# Patient Record
Sex: Male | Born: 1965
Health system: Southern US, Community
[De-identification: ages and names within clinical notes are randomized; demographics above are authoritative.]

## PROBLEM LIST (undated history)

## (undated) DIAGNOSIS — T7840XA Allergy, unspecified, initial encounter: Secondary | ICD-10-CM

## (undated) DIAGNOSIS — G4730 Sleep apnea, unspecified: Secondary | ICD-10-CM

## (undated) DIAGNOSIS — N200 Calculus of kidney: Secondary | ICD-10-CM

## (undated) DIAGNOSIS — I10 Essential (primary) hypertension: Secondary | ICD-10-CM

## (undated) DIAGNOSIS — N289 Disorder of kidney and ureter, unspecified: Secondary | ICD-10-CM

## (undated) HISTORY — PX: VASECTOMY: SHX75

## (undated) HISTORY — DX: Sleep apnea, unspecified: G47.30

## (undated) HISTORY — DX: Calculus of kidney: N20.0

## (undated) HISTORY — DX: Allergy, unspecified, initial encounter: T78.40XA

---

## 1997-08-14 ENCOUNTER — Encounter: Admission: RE | Admit: 1997-08-14 | Discharge: 1997-08-14 | Payer: Self-pay | Admitting: Family Medicine

## 1997-08-21 ENCOUNTER — Encounter: Admission: RE | Admit: 1997-08-21 | Discharge: 1997-08-21 | Payer: Self-pay | Admitting: Family Medicine

## 1998-01-23 ENCOUNTER — Encounter: Admission: RE | Admit: 1998-01-23 | Discharge: 1998-01-23 | Payer: Self-pay | Admitting: Family Medicine

## 1998-01-27 ENCOUNTER — Encounter: Admission: RE | Admit: 1998-01-27 | Discharge: 1998-01-27 | Payer: Self-pay | Admitting: Family Medicine

## 1998-02-12 ENCOUNTER — Encounter: Admission: RE | Admit: 1998-02-12 | Discharge: 1998-02-12 | Payer: Self-pay | Admitting: Family Medicine

## 1998-06-03 ENCOUNTER — Encounter: Admission: RE | Admit: 1998-06-03 | Discharge: 1998-06-03 | Payer: Self-pay | Admitting: Sports Medicine

## 1998-06-05 ENCOUNTER — Encounter: Admission: RE | Admit: 1998-06-05 | Discharge: 1998-06-05 | Payer: Self-pay | Admitting: Family Medicine

## 1999-03-04 ENCOUNTER — Ambulatory Visit (HOSPITAL_COMMUNITY): Admission: RE | Admit: 1999-03-04 | Discharge: 1999-03-04 | Payer: Self-pay | Admitting: Family Medicine

## 1999-03-04 ENCOUNTER — Encounter: Admission: RE | Admit: 1999-03-04 | Discharge: 1999-03-04 | Payer: Self-pay | Admitting: Family Medicine

## 1999-03-17 ENCOUNTER — Encounter: Admission: RE | Admit: 1999-03-17 | Discharge: 1999-03-17 | Payer: Self-pay | Admitting: Family Medicine

## 1999-10-27 ENCOUNTER — Encounter: Admission: RE | Admit: 1999-10-27 | Discharge: 1999-10-27 | Payer: Self-pay | Admitting: Family Medicine

## 1999-11-23 ENCOUNTER — Encounter: Admission: RE | Admit: 1999-11-23 | Discharge: 1999-11-23 | Payer: Self-pay | Admitting: Family Medicine

## 2000-05-17 ENCOUNTER — Ambulatory Visit (HOSPITAL_COMMUNITY): Admission: RE | Admit: 2000-05-17 | Discharge: 2000-05-17 | Payer: Self-pay | Admitting: Family Medicine

## 2000-05-17 ENCOUNTER — Encounter: Admission: RE | Admit: 2000-05-17 | Discharge: 2000-05-17 | Payer: Self-pay | Admitting: Family Medicine

## 2000-05-18 ENCOUNTER — Encounter: Admission: RE | Admit: 2000-05-18 | Discharge: 2000-05-18 | Payer: Self-pay | Admitting: Family Medicine

## 2000-06-22 ENCOUNTER — Encounter: Admission: RE | Admit: 2000-06-22 | Discharge: 2000-06-22 | Payer: Self-pay | Admitting: Family Medicine

## 2001-03-29 ENCOUNTER — Encounter: Admission: RE | Admit: 2001-03-29 | Discharge: 2001-03-29 | Payer: Self-pay | Admitting: Family Medicine

## 2001-05-30 ENCOUNTER — Encounter: Payer: Self-pay | Admitting: *Deleted

## 2001-05-30 ENCOUNTER — Encounter: Admission: RE | Admit: 2001-05-30 | Discharge: 2001-05-30 | Payer: Self-pay | Admitting: *Deleted

## 2002-03-17 ENCOUNTER — Emergency Department (HOSPITAL_COMMUNITY): Admission: EM | Admit: 2002-03-17 | Discharge: 2002-03-17 | Payer: Self-pay | Admitting: Emergency Medicine

## 2002-03-17 ENCOUNTER — Encounter: Payer: Self-pay | Admitting: Emergency Medicine

## 2002-08-14 ENCOUNTER — Encounter: Admission: RE | Admit: 2002-08-14 | Discharge: 2002-08-14 | Payer: Self-pay | Admitting: Family Medicine

## 2003-04-02 ENCOUNTER — Encounter: Admission: RE | Admit: 2003-04-02 | Discharge: 2003-04-02 | Payer: Self-pay | Admitting: Sports Medicine

## 2003-04-02 ENCOUNTER — Encounter: Admission: RE | Admit: 2003-04-02 | Discharge: 2003-04-02 | Payer: Self-pay | Admitting: Family Medicine

## 2003-08-24 ENCOUNTER — Emergency Department (HOSPITAL_COMMUNITY): Admission: EM | Admit: 2003-08-24 | Discharge: 2003-08-24 | Payer: Self-pay | Admitting: *Deleted

## 2003-10-22 ENCOUNTER — Encounter: Admission: RE | Admit: 2003-10-22 | Discharge: 2003-10-22 | Payer: Self-pay | Admitting: Family Medicine

## 2003-10-22 ENCOUNTER — Encounter: Admission: RE | Admit: 2003-10-22 | Discharge: 2003-10-22 | Payer: Self-pay | Admitting: Sports Medicine

## 2004-12-15 IMAGING — US US SCROTUM
1 series · 14 of 25 positions shown · non-contrast
Comparison: none

CLINICAL DATA: Mass in the right scrotum. 
 SCROTAL ULTRASOUND: 
 Right testicle measures 5.1 x 2.0 x 3.0cm and the left testicle measures 4.8 x 2.0 x 2.7cm.  There is a 1.8 x 1.2 x 2.2cm epididymal cyst on the right.  No internal echoes.  On the left there is a 1cm epididymal cyst with internal echoes.  There is normal flow to both right and left testicles.  With valsalva there are thought to be varicoceles on the left.

[Series 1: unknown · 0.09mm/px · 14 of 45 slices shown]
[im 1/45]
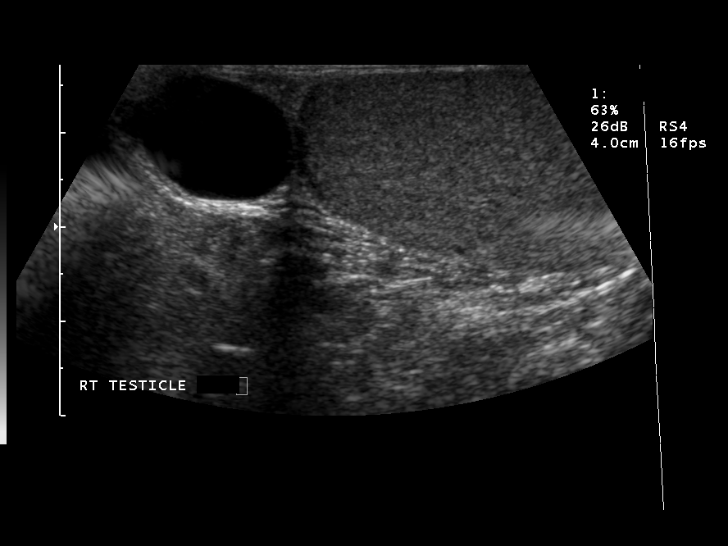
[im 4/45]
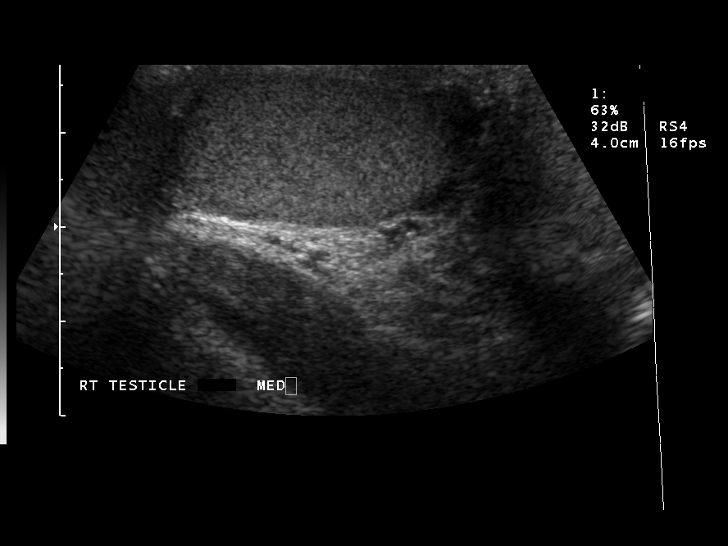
[im 8/45]
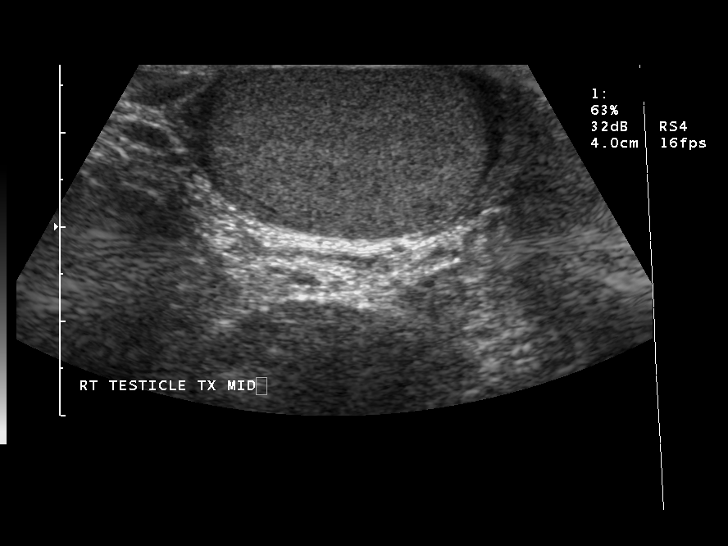
[im 12/45]
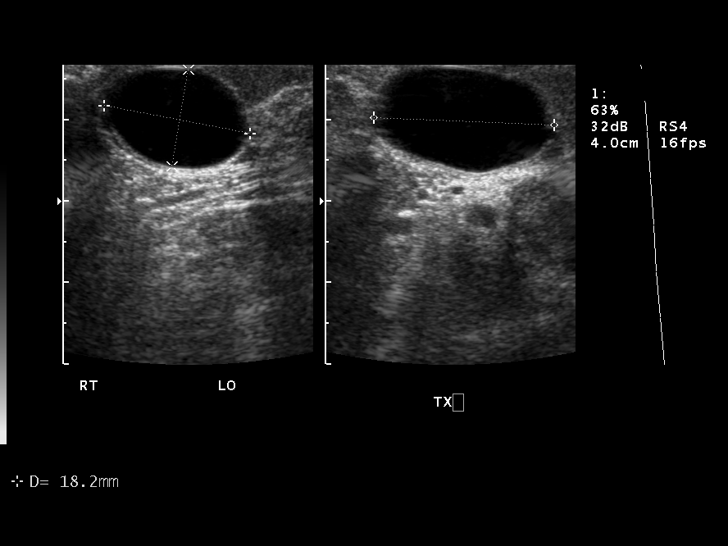
[im 15/45]
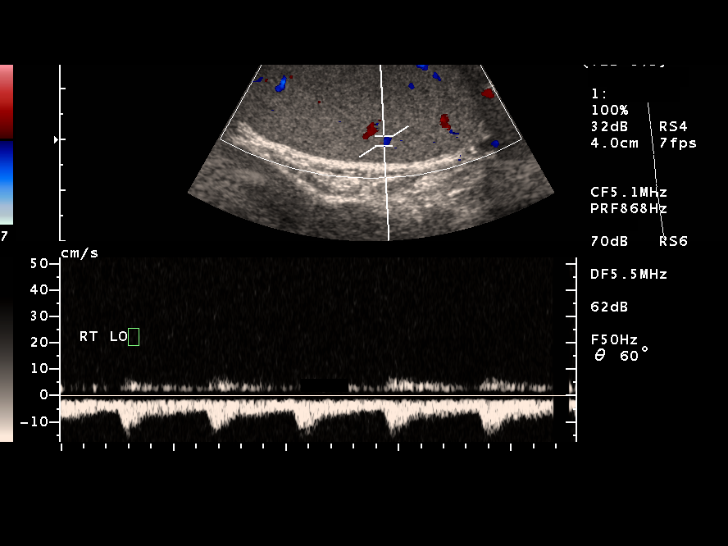
[im 17/45]
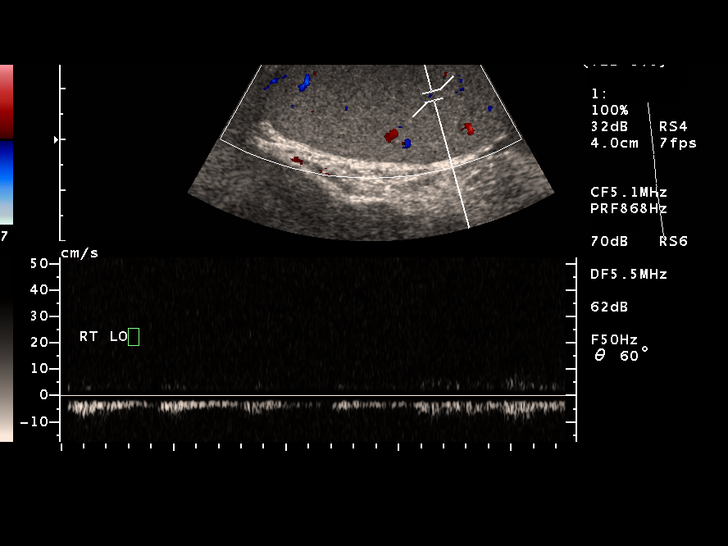
[im 21/45]
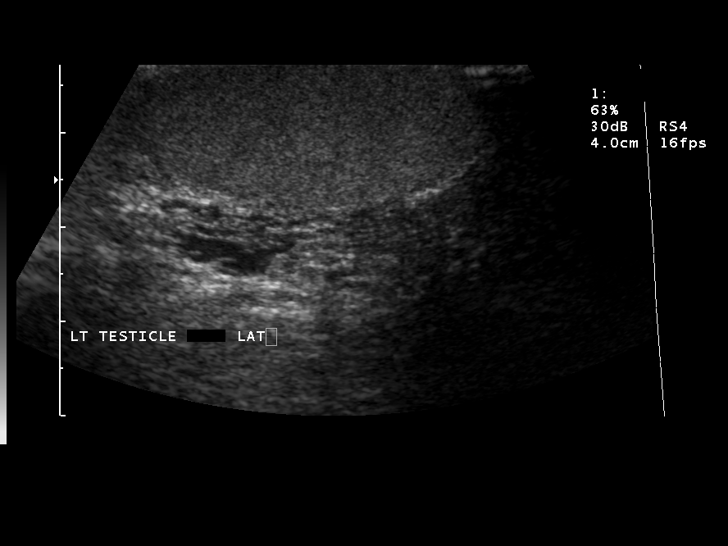
[im 24/45]
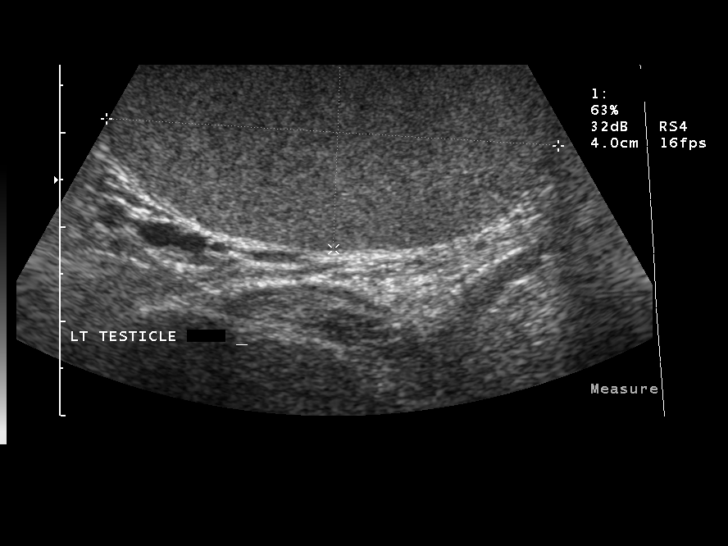
[im 28/45]
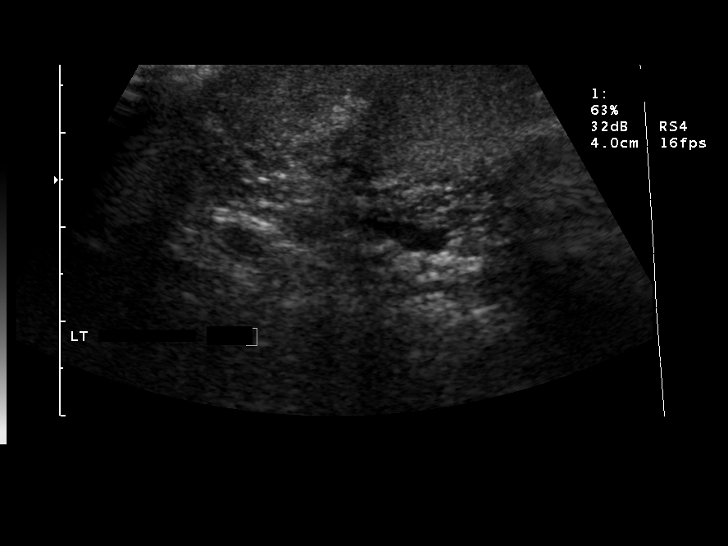
[im 30/45]
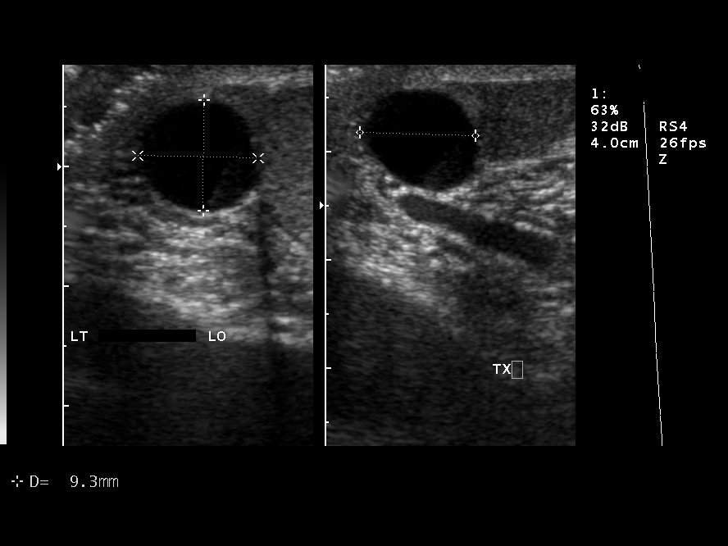
[im 34/45]
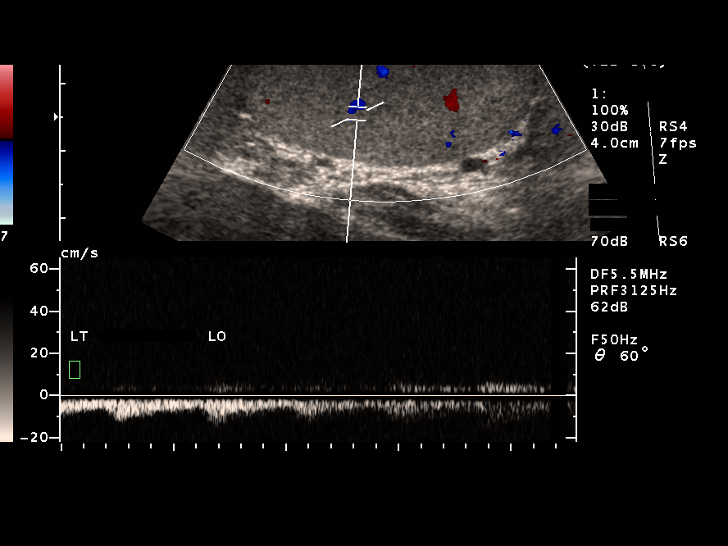
[im 37/45]
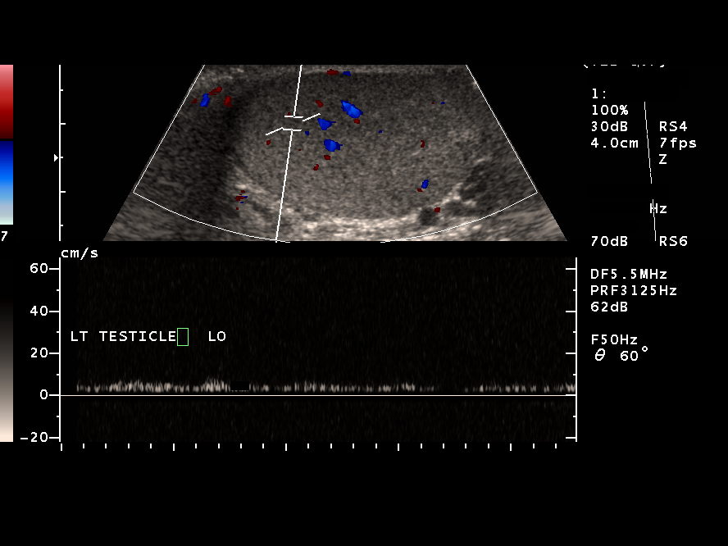
[im 41/45]
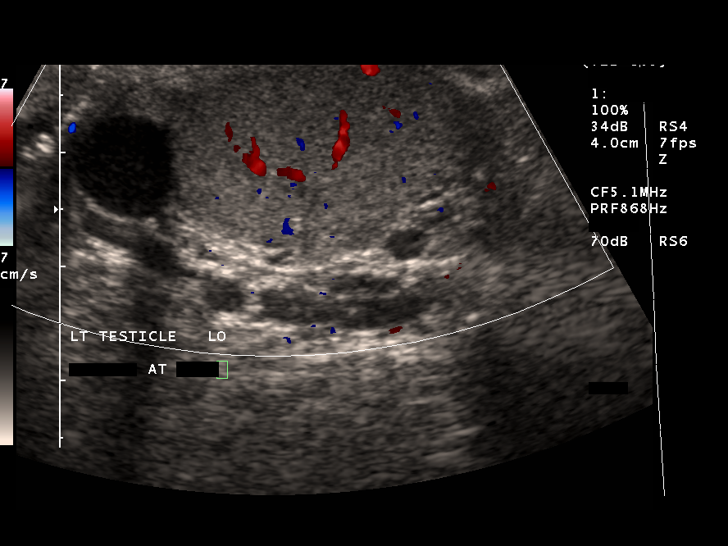
[im 45/45]
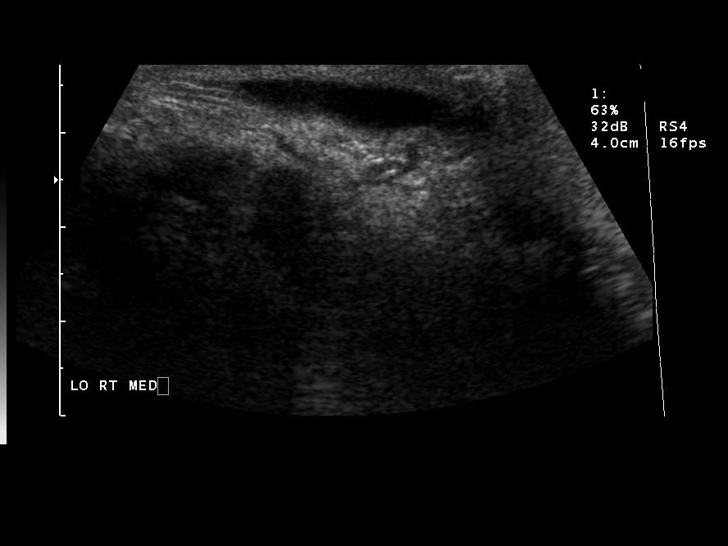

[14 of 25 positions shown; findings below may reference images not displayed]

IMPRESSION: Bilateral epididymal cysts with internal echoes on the left.  Mild varicocele abnormality on the left.

## 2005-05-10 ENCOUNTER — Emergency Department (HOSPITAL_COMMUNITY): Admission: EM | Admit: 2005-05-10 | Discharge: 2005-05-10 | Payer: Self-pay | Admitting: Emergency Medicine

## 2006-05-12 DIAGNOSIS — F411 Generalized anxiety disorder: Secondary | ICD-10-CM | POA: Insufficient documentation

## 2012-02-26 ENCOUNTER — Emergency Department (HOSPITAL_COMMUNITY): Payer: BC Managed Care – PPO

## 2012-02-26 ENCOUNTER — Encounter (HOSPITAL_COMMUNITY): Payer: Self-pay | Admitting: *Deleted

## 2012-02-26 ENCOUNTER — Emergency Department (HOSPITAL_COMMUNITY)
Admission: EM | Admit: 2012-02-26 | Discharge: 2012-02-26 | Disposition: A | Discharge status: Home / Self Care | Payer: BC Managed Care – PPO | Attending: Emergency Medicine | Admitting: Emergency Medicine

## 2012-02-26 DIAGNOSIS — R0602 Shortness of breath: Secondary | ICD-10-CM | POA: Insufficient documentation

## 2012-02-26 DIAGNOSIS — J3489 Other specified disorders of nose and nasal sinuses: Secondary | ICD-10-CM | POA: Insufficient documentation

## 2012-02-26 DIAGNOSIS — IMO0001 Reserved for inherently not codable concepts without codable children: Secondary | ICD-10-CM | POA: Insufficient documentation

## 2012-02-26 DIAGNOSIS — J40 Bronchitis, not specified as acute or chronic: Secondary | ICD-10-CM

## 2012-02-26 DIAGNOSIS — J069 Acute upper respiratory infection, unspecified: Secondary | ICD-10-CM

## 2012-02-26 MED ORDER — ALBUTEROL SULFATE HFA 108 (90 BASE) MCG/ACT IN AERS
2.0000 | INHALATION_SPRAY | RESPIRATORY_TRACT | Status: DC
  Administered 2012-02-26: 2 via RESPIRATORY_TRACT
  Filled 2012-02-26: qty 6.7

## 2012-02-26 MED ORDER — HYDROCOD POLST-CHLORPHEN POLST 10-8 MG/5ML PO LQCR: 5.0000 mL | Freq: Two times a day (BID) | ORAL | Status: DC

## 2012-02-26 MED ORDER — IBUPROFEN 800 MG PO TABS
800.0000 mg | ORAL_TABLET | Freq: Once | ORAL | Status: AC
  Administered 2012-02-26: 800 mg via ORAL
  Filled 2012-02-26: qty 1

## 2012-02-26 MED ORDER — HYDROCOD POLST-CHLORPHEN POLST 10-8 MG/5ML PO LQCR
5.0000 mL | Freq: Once | ORAL | Status: AC
  Administered 2012-02-26: 5 mL via ORAL
  Filled 2012-02-26: qty 5

## 2012-02-26 MED ORDER — PREDNISONE 50 MG PO TABS
60.0000 mg | ORAL_TABLET | Freq: Once | ORAL | Status: AC
  Administered 2012-02-26: 60 mg via ORAL
  Filled 2012-02-26: qty 1

## 2012-02-26 MED ORDER — PREDNISONE 10 MG PO TABS: ORAL_TABLET | ORAL | Status: DC

## 2012-02-26 NOTE — ED Provider Notes (Signed)
Medical screening examination/treatment/procedure(s) were performed by non-physician practitioner and as supervising physician I was immediately available for consultation/collaboration.  Zarielle Cea R. Neosha Switalski, MD 02/26/12 2247 

## 2012-02-26 NOTE — ED Provider Notes (Signed)
History     CSN: [id number]  Arrival date & time 02/26/12  1845   First MD Initiated Contact with Patient 02/26/12 1937      Chief Complaint  Patient presents with  . Cough    (Consider location/radiation/quality/duration/timing/severity/associated sxs/prior treatment) Patient is a 46 y.o. male presenting with cough. The history is provided by the patient.  Cough This is a new problem. The current episode started more than 1 week ago. The problem occurs every few minutes. The problem has not changed since onset.The cough is non-productive. There has been no fever. Associated symptoms include rhinorrhea, myalgias and shortness of breath. Pertinent negatives include no chest pain, no chills, no weight loss and no wheezing. Associated symptoms comments: Chest wall soreness.. He has tried cough syrup for the symptoms. The treatment provided no relief. He is not a smoker. His past medical history is significant for bronchitis. His past medical history does not include COPD, emphysema or asthma.    History reviewed. No pertinent past medical history.  History reviewed. No pertinent past surgical history.  History reviewed. No pertinent family history.  History  Substance Use Topics  . Smoking status: Never Smoker   . Smokeless tobacco: Not on file  . Alcohol Use: No      Review of Systems  Constitutional: Negative for chills, weight loss and activity change.       All ROS Neg except as noted in HPI  HENT: Positive for rhinorrhea. Negative for nosebleeds and neck pain.   Eyes: Negative for photophobia and discharge.  Respiratory: Positive for cough and shortness of breath. Negative for wheezing.   Cardiovascular: Negative for chest pain and palpitations.  Gastrointestinal: Negative for abdominal pain and blood in stool.  Genitourinary: Negative for dysuria, frequency and hematuria.  Musculoskeletal: Positive for myalgias. Negative for back pain and arthralgias.  Skin: Negative.    Neurological: Negative for dizziness, seizures and speech difficulty.  Psychiatric/Behavioral: Negative for hallucinations and confusion.    Allergies  Review of patient's allergies indicates no known allergies.  Home Medications  No current outpatient prescriptions on file.  BP 145/87  Pulse 82  Temp 98.8 F (37.1 C) (Oral)  Resp 16  Ht 5\' 7"  (1.702 m)  Wt 160 lb (72.576 kg)  BMI 25.06 kg/m2  SpO2 97%  Physical Exam  Nursing note and vitals reviewed. Constitutional: He is oriented to person, place, and time. He appears well-developed and well-nourished.  Non-toxic appearance.  HENT:  Head: Normocephalic.  Right Ear: Tympanic membrane and external ear normal.  Left Ear: Tympanic membrane and external ear normal.       Nasal congestion present  Eyes: EOM and lids are normal. Pupils are equal, round, and reactive to light.  Neck: Normal range of motion. Neck supple. Carotid bruit is not present.  Cardiovascular: Normal rate, regular rhythm, normal heart sounds, intact distal pulses and normal pulses.   Pulmonary/Chest: No respiratory distress.       Rhonchi and scattered wheezes present. Symmetrical rise and fall of the chest. Frequent cough during examination.  Abdominal: Soft. Bowel sounds are normal. There is no tenderness. There is no guarding.  Musculoskeletal: Normal range of motion.  Lymphadenopathy:       Head (right side): No submandibular adenopathy present.       Head (left side): No submandibular adenopathy present.    He has no cervical adenopathy.  Neurological: He is alert and oriented to person, place, and time. He has normal strength. No cranial nerve  deficit or sensory deficit.  Skin: Skin is warm and dry.  Psychiatric: He has a normal mood and affect. His speech is normal.    ED Course  Procedures (including critical care time)  Labs Reviewed - No data to display No results found.   No diagnosis found.    MDM  I have reviewed nursing notes,  vital signs, and all appropriate lab and imaging results for this patient. The chest x-ray reveals chronic bronchitic changes. No focal consolidation or pneumonia. Patient coughing less after albuterol and Tussionex. The plan at this time is to treat the patient with steroids, albuterol inhaler, and Tussionex. Patient advised to wash hands frequently and to increase fluids. Patient to see his primary physician if not improving.       Kathie Dike, [place] 02/26/12 2034

## 2012-02-26 NOTE — ED Notes (Signed)
Pt c/o cough and congestion for 3 weeks. Pt states cough worse with deep breath.

## 2012-03-01 ENCOUNTER — Emergency Department (HOSPITAL_COMMUNITY)
Admission: EM | Admit: 2012-03-01 | Discharge: 2012-03-01 | Disposition: A | Discharge status: Home / Self Care | Payer: BC Managed Care – PPO | Attending: Emergency Medicine | Admitting: Emergency Medicine

## 2012-03-01 ENCOUNTER — Encounter (HOSPITAL_COMMUNITY): Payer: Self-pay | Admitting: Emergency Medicine

## 2012-03-01 ENCOUNTER — Emergency Department (HOSPITAL_COMMUNITY): Payer: BC Managed Care – PPO

## 2012-03-01 DIAGNOSIS — Z79899 Other long term (current) drug therapy: Secondary | ICD-10-CM | POA: Insufficient documentation

## 2012-03-01 DIAGNOSIS — N259 Disorder resulting from impaired renal tubular function, unspecified: Secondary | ICD-10-CM | POA: Insufficient documentation

## 2012-03-01 DIAGNOSIS — R109 Unspecified abdominal pain: Secondary | ICD-10-CM | POA: Insufficient documentation

## 2012-03-01 DIAGNOSIS — N201 Calculus of ureter: Secondary | ICD-10-CM | POA: Insufficient documentation

## 2012-03-01 DIAGNOSIS — R319 Hematuria, unspecified: Secondary | ICD-10-CM | POA: Insufficient documentation

## 2012-03-01 HISTORY — DX: Disorder of kidney and ureter, unspecified: N28.9

## 2012-03-01 LAB — URINALYSIS, ROUTINE W REFLEX MICROSCOPIC
Glucose, UA: NEGATIVE mg/dL
Specific Gravity, Urine: 1.025 (ref 1.005–1.030)

## 2012-03-01 LAB — CBC WITH DIFFERENTIAL/PLATELET
Eosinophils Relative: 0 % (ref 0–5)
Hemoglobin: 12.4 g/dL — ABNORMAL LOW (ref 13.0–17.0)
Lymphocytes Relative: 16 % (ref 12–46)
Lymphs Abs: 1.1 10*3/uL (ref 0.7–4.0)
MCV: 91.8 fL (ref 78.0–100.0)
Platelets: 241 10*3/uL (ref 150–400)
RBC: 4.04 MIL/uL — ABNORMAL LOW (ref 4.22–5.81)
WBC: 6.7 10*3/uL (ref 4.0–10.5)

## 2012-03-01 LAB — URINE MICROSCOPIC-ADD ON

## 2012-03-01 LAB — BASIC METABOLIC PANEL
CO2: 29 mEq/L (ref 19–32)
Calcium: 8.2 mg/dL — ABNORMAL LOW (ref 8.4–10.5)
Glucose, Bld: 106 mg/dL — ABNORMAL HIGH (ref 70–99)
Potassium: 3.4 mEq/L — ABNORMAL LOW (ref 3.5–5.1)
Sodium: 138 mEq/L (ref 135–145)

## 2012-03-01 MED ORDER — MORPHINE SULFATE 4 MG/ML IJ SOLN
4.0000 mg | Freq: Once | INTRAMUSCULAR | Status: AC
  Administered 2012-03-01: 4 mg via INTRAVENOUS
  Filled 2012-03-01: qty 1

## 2012-03-01 MED ORDER — ONDANSETRON 4 MG PO TBDP: 4.0000 mg | ORAL_TABLET | Freq: Three times a day (TID) | ORAL | Status: DC | PRN

## 2012-03-01 MED ORDER — HYDROCODONE-ACETAMINOPHEN 5-325 MG PO TABS: 1.0000 | ORAL_TABLET | Freq: Four times a day (QID) | ORAL | Status: DC | PRN

## 2012-03-01 MED ORDER — PROMETHAZINE HCL 25 MG PO TABS: 25.0000 mg | ORAL_TABLET | Freq: Four times a day (QID) | ORAL | Status: DC | PRN

## 2012-03-01 MED ORDER — ONDANSETRON HCL 4 MG/2ML IJ SOLN
4.0000 mg | Freq: Once | INTRAMUSCULAR | Status: AC
  Administered 2012-03-01: 4 mg via INTRAVENOUS
  Filled 2012-03-01: qty 2

## 2012-03-01 MED ORDER — ONDANSETRON HCL 4 MG/2ML IJ SOLN
INTRAMUSCULAR | Status: AC
  Filled 2012-03-01: qty 2

## 2012-03-01 MED ORDER — MORPHINE SULFATE 4 MG/ML IJ SOLN
INTRAMUSCULAR | Status: AC
  Filled 2012-03-01: qty 1

## 2012-03-01 MED ORDER — KETOROLAC TROMETHAMINE 30 MG/ML IJ SOLN
30.0000 mg | Freq: Once | INTRAMUSCULAR | Status: AC
  Administered 2012-03-01: 30 mg via INTRAVENOUS
  Filled 2012-03-01: qty 1

## 2012-03-01 MED ORDER — SODIUM CHLORIDE 0.9 % IV SOLN
INTRAVENOUS | Status: DC
  Administered 2012-03-01: 1000 mL via INTRAVENOUS

## 2012-03-01 MED ORDER — MORPHINE SULFATE 4 MG/ML IJ SOLN
4.0000 mg | Freq: Once | INTRAMUSCULAR | Status: AC
  Administered 2012-03-01: 4 mg via INTRAVENOUS

## 2012-03-01 MED ORDER — ONDANSETRON HCL 4 MG/2ML IJ SOLN
4.0000 mg | Freq: Once | INTRAMUSCULAR | Status: AC
  Administered 2012-03-01: 4 mg via INTRAVENOUS

## 2012-03-01 MED ORDER — SODIUM CHLORIDE 0.9 % IV BOLUS (SEPSIS)
250.0000 mL | Freq: Once | INTRAVENOUS | Status: AC
  Administered 2012-03-01: 250 mL via INTRAVENOUS

## 2012-03-01 NOTE — ED Notes (Signed)
Pt alert & oriented x4, stable gait. Patient given discharge instructions, paperwork & prescription(s). Patient  instructed to stop at the registration desk to finish any additional paperwork. Patient verbalized understanding. Pt left department w/ no further questions. 

## 2012-03-01 NOTE — ED Notes (Signed)
Pt c/o left flank pain

## 2012-03-01 NOTE — ED Provider Notes (Signed)
History     CSN: [id number]  Arrival date & time 03/01/12  7829   First MD Initiated Contact with Patient 03/01/12 1904      Chief Complaint  Patient presents with  . Flank Pain    (Consider location/radiation/quality/duration/timing/severity/associated sxs/prior treatment) Patient is a 46 y.o. male presenting with flank pain. The history is provided by the patient and the spouse.  Flank Pain Associated symptoms include abdominal pain. Pertinent negatives include no chest pain, no headaches and no shortness of breath.   patient with history of renal stones about every 3-5 years. Acute onset of left flank pain at about 5 PM this evening. Associated with nausea no vomiting the pain escalated to a 10 out of 10 sharp and colicky in nature nonradiating. [facility]'s [facility] currently does not have a urologist. Last done was 3-4 years ago.  Past Medical History  Diagnosis Date  . Renal disorder     Past Surgical History  Procedure Date  . Vasectomy     History reviewed. No pertinent family history.  History  Substance Use Topics  . Smoking status: Never Smoker   . Smokeless tobacco: Not on file  . Alcohol Use: No      Review of Systems  Constitutional: Negative for fever.  HENT: Negative for congestion and neck pain.   Respiratory: Negative for shortness of breath.   Cardiovascular: Negative for chest pain.  Gastrointestinal: Positive for nausea and abdominal pain. Negative for vomiting and diarrhea.  Genitourinary: Positive for hematuria and flank pain. Negative for dysuria.  Skin: Negative for rash.  Neurological: Negative for headaches.  Hematological: Does not bruise/bleed easily.    Allergies  Review of patient's allergies indicates no known allergies.  Home Medications   Current Outpatient Rx  Name  Route  Sig  Dispense  Refill  . HYDROCOD POLST-CPM POLST ER 10-8 MG/5ML PO LQCR   Oral   Take 5 mLs by mouth every 12 (twelve)  hours.   140 mL   0   . PREDNISONE 10 MG PO TABS   Oral   Take 10-60 mg by mouth. 6,5,4,3,2,1 - take with food         . HYDROCODONE-ACETAMINOPHEN 5-325 MG PO TABS   Oral   Take 1-2 tablets by mouth every 6 (six) hours as needed for pain.   20 tablet   0   . ONDANSETRON 4 MG PO TBDP   Oral   Take 1 tablet (4 mg total) by mouth every 8 (eight) hours as needed for nausea.   10 tablet   0   . PROMETHAZINE HCL 25 MG PO TABS   Oral   Take 1 tablet (25 mg total) by mouth every 6 (six) hours as needed for nausea.   12 tablet   0     BP 139/93  Pulse 91  Temp 97.8 F (36.6 C) (Oral)  Resp 22  Ht 5\' 7"  (1.702 m)  Wt 160 lb (72.576 kg)  BMI 25.06 kg/m2  SpO2 98%  Physical Exam  Vitals reviewed. Constitutional: He is oriented to person, place, and time. He appears well-developed and well-nourished. He appears distressed.  HENT:  Head: Normocephalic and atraumatic.  Mouth/Throat: Oropharynx is clear and moist.  Eyes: Conjunctivae normal and EOM are normal. Pupils are equal, round, and reactive to light.  Neck: Normal range of motion. Neck supple.  Cardiovascular: Normal rate, regular rhythm and normal heart sounds.   Pulmonary/Chest: Effort normal and breath  sounds normal. No respiratory distress. He has no wheezes.  Abdominal: Soft. Bowel sounds are normal. There is no tenderness.  Musculoskeletal: Normal range of motion. He exhibits no edema.  Neurological: He is alert and oriented to person, place, and time. No cranial nerve deficit. Coordination normal.  Skin: Skin is warm. No rash noted.    ED Course  Procedures (including critical care time)  Labs Reviewed  CBC WITH DIFFERENTIAL - Abnormal; Notable for the following:    RBC 4.04 (*)     Hemoglobin 12.4 (*)     HCT 37.1 (*)     Monocytes Relative 20 (*)     Monocytes Absolute 1.3 (*)     All other components within normal limits  BASIC METABOLIC PANEL - Abnormal; Notable for the following:    Potassium 3.4  (*)     Glucose, Bld 106 (*)     Calcium 8.2 (*)     All other components within normal limits  URINALYSIS, ROUTINE W REFLEX MICROSCOPIC - Abnormal; Notable for the following:    Hgb urine dipstick MODERATE (*)     All other components within normal limits  URINE MICROSCOPIC-ADD ON - Abnormal; Notable for the following:    Squamous Epithelial / LPF FEW (*)     All other components within normal limits   Ct Abdomen Pelvis Wo Contrast  03/01/2012  *RADIOLOGY REPORT*  Clinical Data: Left flank pain, history of kidney stones  CT ABDOMEN AND PELVIS WITHOUT CONTRAST  Technique:  Multidetector CT imaging of the abdomen and pelvis was performed following the standard protocol without intravenous contrast.  Comparison: None.  Findings: Lung bases are clear.  Unenhanced liver, spleen, pancreas, and adrenal glands are within normal limits.  Gallbladder is underdistended.  No intrahepatic or extrahepatic ductal dilatation.  Right kidney is unremarkable.  Mild left hydronephrosis.  No renal calculi.  No evidence of bowel obstruction.  No evidence of abdominal aortic aneurysm.  No abdominopelvic ascites.  No suspicious abdominopelvic lymphadenopathy.  Prostate is unremarkable.  3 mm distal left ureteral calculus (coronal image 49).  Bladder is within normal limits.  Mild degenerative changes of the visualized thoracolumbar spine.  IMPRESSION: 3 mm distal left ureteral calculus with mild left hydronephrosis.   Original Report Authenticated By: Charline Bills, M.D.    Results for orders placed during the hospital encounter of 03/01/12  CBC WITH DIFFERENTIAL      Component Value Range   WBC 6.7  4.0 - 10.5 K/uL   RBC 4.04 (*) 4.22 - 5.81 MIL/uL   Hemoglobin 12.4 (*) 13.0 - 17.0 g/dL   HCT 40.9 (*) 81.1 - 91.4 %   MCV 91.8  78.0 - 100.0 fL   MCH 30.7  26.0 - 34.0 pg   MCHC 33.4  30.0 - 36.0 g/dL   RDW 78.2  95.6 - 21.3 %   Platelets 241  150 - 400 K/uL   Neutrophils Relative 64  43 - 77 %   Neutro Abs  4.3  1.7 - 7.7 K/uL   Lymphocytes Relative 16  12 - 46 %   Lymphs Abs 1.1  0.7 - 4.0 K/uL   Monocytes Relative 20 (*) 3 - 12 %   Monocytes Absolute 1.3 (*) 0.1 - 1.0 K/uL   Eosinophils Relative 0  0 - 5 %   Eosinophils Absolute 0.0  0.0 - 0.7 K/uL   Basophils Relative 0  0 - 1 %   Basophils Absolute 0.0  0.0 - 0.1 K/uL  BASIC METABOLIC PANEL      Component Value Range   Sodium 138  135 - 145 mEq/L   Potassium 3.4 (*) 3.5 - 5.1 mEq/L   Chloride 103  96 - 112 mEq/L   CO2 29  19 - 32 mEq/L   Glucose, Bld 106 (*) 70 - 99 mg/dL   BUN 23  6 - 23 mg/dL   Creatinine, Ser 4.09  0.50 - 1.35 mg/dL   Calcium 8.2 (*) 8.4 - 10.5 mg/dL   GFR calc non Af Amer >90  >90 mL/min   GFR calc Af Amer >90  >90 mL/min  URINALYSIS, ROUTINE W REFLEX MICROSCOPIC      Component Value Range   Color, Urine YELLOW  YELLOW   APPearance CLEAR  CLEAR   Specific Gravity, Urine 1.025  1.005 - 1.030   pH 5.5  5.0 - 8.0   Glucose, UA NEGATIVE  NEGATIVE mg/dL   Hgb urine dipstick MODERATE (*) NEGATIVE   Bilirubin Urine NEGATIVE  NEGATIVE   Ketones, ur NEGATIVE  NEGATIVE mg/dL   Protein, ur NEGATIVE  NEGATIVE mg/dL   Urobilinogen, UA 0.2  0.0 - 1.0 mg/dL   Nitrite NEGATIVE  NEGATIVE   Leukocytes, UA NEGATIVE  NEGATIVE  URINE MICROSCOPIC-ADD ON      Component Value Range   Squamous Epithelial / LPF FEW (*) RARE   WBC, UA 0-2  <3 WBC/hpf   RBC / HPF 11-20  <3 RBC/hpf   Bacteria, UA RARE  RARE     1. Ureteral stone       MDM   Patient with history of kidney stones has one about every 3-5 years. Onset of left flank pain that acute at 5:00 this evening. No vomiting but does have nausea. Follow[facility] by [facility]. Currently does not have a urologist. CT shows a 3 mm stone on the left side with hematuria on labs consistent with renal stone. No significant hydronephrosis or renal insufficiency no significant leukocytosis. Patient improved in the emergency department with morphine IV as a bad reaction  to the lauded. Will discharge home with hydrocodone anti-medics and 800 mg of Motrin every 8 hours. Patient read dose with pain medicine and nausea medicine right prior to discharge along with Toradol. Patient referred to [facility] [place] [facility] for followup.       Shelda Jakes, MD 03/01/12 2122

## 2012-03-10 ENCOUNTER — Emergency Department (HOSPITAL_COMMUNITY): Payer: BC Managed Care – PPO

## 2012-03-10 ENCOUNTER — Encounter (HOSPITAL_COMMUNITY): Payer: Self-pay | Admitting: *Deleted

## 2012-03-10 ENCOUNTER — Emergency Department (HOSPITAL_COMMUNITY)
Admission: EM | Admit: 2012-03-10 | Discharge: 2012-03-10 | Disposition: A | Discharge status: Home / Self Care | Payer: BC Managed Care – PPO | Attending: Emergency Medicine | Admitting: Emergency Medicine

## 2012-03-10 DIAGNOSIS — R319 Hematuria, unspecified: Secondary | ICD-10-CM | POA: Insufficient documentation

## 2012-03-10 DIAGNOSIS — R350 Frequency of micturition: Secondary | ICD-10-CM | POA: Insufficient documentation

## 2012-03-10 DIAGNOSIS — R300 Dysuria: Secondary | ICD-10-CM | POA: Insufficient documentation

## 2012-03-10 DIAGNOSIS — R358 Other polyuria: Secondary | ICD-10-CM | POA: Insufficient documentation

## 2012-03-10 DIAGNOSIS — N200 Calculus of kidney: Secondary | ICD-10-CM

## 2012-03-10 DIAGNOSIS — R3589 Other polyuria: Secondary | ICD-10-CM | POA: Insufficient documentation

## 2012-03-10 DIAGNOSIS — Z87448 Personal history of other diseases of urinary system: Secondary | ICD-10-CM | POA: Insufficient documentation

## 2012-03-10 DIAGNOSIS — R1110 Vomiting, unspecified: Secondary | ICD-10-CM | POA: Insufficient documentation

## 2012-03-10 LAB — URINALYSIS, ROUTINE W REFLEX MICROSCOPIC
Ketones, ur: NEGATIVE mg/dL
Leukocytes, UA: NEGATIVE
Nitrite: NEGATIVE

## 2012-03-10 MED ORDER — ONDANSETRON HCL 4 MG/2ML IJ SOLN
4.0000 mg | Freq: Once | INTRAMUSCULAR | Status: AC
  Administered 2012-03-10: 4 mg via INTRAVENOUS
  Filled 2012-03-10: qty 2

## 2012-03-10 MED ORDER — SODIUM CHLORIDE 0.9 % IV SOLN
Freq: Once | INTRAVENOUS | Status: AC
  Administered 2012-03-10: 15:00:00 via INTRAVENOUS

## 2012-03-10 MED ORDER — FENTANYL CITRATE 0.05 MG/ML IJ SOLN
INTRAMUSCULAR | Status: AC
  Administered 2012-03-10: 50 ug
  Filled 2012-03-10: qty 2

## 2012-03-10 MED ORDER — TAMSULOSIN HCL 0.4 MG PO CAPS: 0.4000 mg | ORAL_CAPSULE | Freq: Every day | ORAL | Status: DC

## 2012-03-10 MED ORDER — KETOROLAC TROMETHAMINE 30 MG/ML IJ SOLN
30.0000 mg | Freq: Once | INTRAMUSCULAR | Status: AC
  Administered 2012-03-10: 30 mg via INTRAVENOUS
  Filled 2012-03-10: qty 1

## 2012-03-10 MED ORDER — KETOROLAC TROMETHAMINE 10 MG PO TABS: 10.0000 mg | ORAL_TABLET | Freq: Four times a day (QID) | ORAL | Status: DC | PRN

## 2012-03-10 MED ORDER — ONDANSETRON HCL 4 MG/2ML IJ SOLN
INTRAMUSCULAR | Status: AC
  Administered 2012-03-10: 4 mg
  Filled 2012-03-10: qty 2

## 2012-03-10 MED ORDER — MORPHINE SULFATE 4 MG/ML IJ SOLN
4.0000 mg | Freq: Once | INTRAMUSCULAR | Status: AC
  Administered 2012-03-10: 4 mg via INTRAVENOUS
  Filled 2012-03-10: qty 1

## 2012-03-10 MED ORDER — OXYCODONE-ACETAMINOPHEN 5-325 MG PO TABS: 1.0000 | ORAL_TABLET | Freq: Four times a day (QID) | ORAL | Status: DC | PRN

## 2012-03-10 NOTE — ED Notes (Signed)
Pt states left flank pain, vomited x 2 and blood in urine. Symptoms began this morning.

## 2012-03-10 NOTE — ED Provider Notes (Addendum)
History   This chart was scribed for Donnetta Hutching, MD, by Frederik Pear, ER scribe. The patient was seen in room APA01/APA01 and the patient's care was started at 1506.    CSN: [id number]  Arrival date & time 03/10/12  1420   First MD Initiated Contact with Patient 03/10/12 1506      Chief Complaint  Patient presents with  . Nephrolithiasis    (Consider location/radiation/quality/duration/timing/severity/associated sxs/prior treatment) HPI  Stephen Sanders is a 46 y.o. male with a h/o of nephrolithiasis who presents to the Emergency Department complaining of constant, moderate, left flank pain with associated polyuria, hematuria, emesis 2x,  and dysuria that radiates to his genitalia and began this morning. He was last seen on 12/18 for similar symptoms and was diagnosed with nephrolithiasis.  Nothing makes symptoms better or worse    PCP is [facility].   Past Medical History  Diagnosis Date  . Renal disorder     Past Surgical History  Procedure Date  . Vasectomy     No family history on file.  History  Substance Use Topics  . Smoking status: Never Smoker   . Smokeless tobacco: Not on file  . Alcohol Use: No      Review of Systems  Gastrointestinal: Positive for vomiting.  Genitourinary: Positive for dysuria, frequency and hematuria.  Musculoskeletal:       Left flank pain  All other systems reviewed and are negative.    Allergies  Review of patient's allergies indicates no known allergies.  Home Medications   Current Outpatient Rx  Name  Route  Sig  Dispense  Refill  . HYDROCOD POLST-CPM POLST ER 10-8 MG/5ML PO LQCR   Oral   Take 5 mLs by mouth every 12 (twelve) hours.   140 mL   0   . HYDROCODONE-ACETAMINOPHEN 5-325 MG PO TABS   Oral   Take 1-2 tablets by mouth every 6 (six) hours as needed for pain.   20 tablet   0   . ONDANSETRON 4 MG PO TBDP   Oral   Take 1 tablet (4 mg total) by mouth every 8 (eight) hours as needed  for nausea.   10 tablet   0   . PROMETHAZINE HCL 25 MG PO TABS   Oral   Take 1 tablet (25 mg total) by mouth every 6 (six) hours as needed for nausea.   12 tablet   0     BP 126/60  Pulse 81  Temp 97.9 F (36.6 C) (Oral)  Resp 16  Ht 5\' 7"  (1.702 m)  Wt 160 lb (72.576 kg)  BMI 25.06 kg/m2  SpO2 99%  Physical Exam  Nursing note and vitals reviewed. Constitutional: He is oriented to person, place, and time. He appears well-developed and well-nourished.  HENT:  Head: Normocephalic and atraumatic.  Eyes: Conjunctivae normal and EOM are normal. Pupils are equal, round, and reactive to light.  Neck: Normal range of motion. Neck supple.  Cardiovascular: Normal rate, regular rhythm and normal heart sounds.   Pulmonary/Chest: Effort normal and breath sounds normal.  Abdominal: Soft. Bowel sounds are normal.       He has minimal left flank tenderness.  Musculoskeletal: Normal range of motion.  Neurological: He is alert and oriented to person, place, and time.  Skin: Skin is warm and dry.  Psychiatric: He has a normal mood and affect.    ED Course  Procedures (including critical care time)  DIAGNOSTIC STUDIES: Oxygen Saturation is  99% on room air, normal by my interpretation.    COORDINATION OF CARE:  15:21- Discussed planned course of treatment with the patient, including pain medication and an abdominal X-ray, who is agreeable at this time.  Results for orders placed during the hospital encounter of 03/10/12  URINALYSIS, ROUTINE W REFLEX MICROSCOPIC      Component Value Range   Color, Urine YELLOW  YELLOW   APPearance CLEAR  CLEAR   Specific Gravity, Urine 1.015  1.005 - 1.030   pH 8.5 (*) 5.0 - 8.0   Glucose, UA NEGATIVE  NEGATIVE mg/dL   Hgb urine dipstick LARGE (*) NEGATIVE   Bilirubin Urine NEGATIVE  NEGATIVE   Ketones, ur NEGATIVE  NEGATIVE mg/dL   Protein, ur TRACE (*) NEGATIVE mg/dL   Urobilinogen, UA 0.2  0.0 - 1.0 mg/dL   Nitrite NEGATIVE  NEGATIVE    Leukocytes, UA NEGATIVE  NEGATIVE  URINE MICROSCOPIC-ADD ON      Component Value Range   WBC, UA 0-2  <3 WBC/hpf   RBC / HPF TOO NUMEROUS TO COUNT  <3 RBC/hpf    Dg Abd 1 View  03/10/2012  *RADIOLOGY REPORT*  Clinical Data: Left flank pain.  ABDOMEN - 1 VIEW  Comparison: CT 03/01/2012.  Findings: Normal bowel gas pattern. Small calcification of approximately the same size as the previously seen distal left ureteral stone now projects near the midline.  This may now be within the bladder although this is difficult to confirm by plain film.  No additional suspicious calcifications.  No free air on the supine image.  No acute bony abnormality.  IMPRESSION: Small calcification near the midline of the pelvis may represent the previously seen distal left ureteral stone.  The near midline location may signify passage into the bladder, but exact location cannot be confirmed by plain film.   Original Report Authenticated By: Charlett Nose, M.D.      No diagnosis found.    MDM  Good history for left-sided stone. Urinalysis shows too numerous to count red cells. Plain x-rays reveals calcification consistent with a distal left ureteral stone. Discharge home on Percocet #30, Flomax 0.4 mg #20 and Toradol 10 mg tablets #20.  Followup with urologist  I personally performed the services described in this documentation, which was scribed in my presence. The recorded information has been reviewed and is accurate.        Donnetta Hutching, MD 03/10/12 1740  Donnetta Hutching, MD 03/10/12 [phone]

## 2014-12-30 ENCOUNTER — Other Ambulatory Visit (HOSPITAL_COMMUNITY): Payer: Self-pay | Admitting: Radiology

## 2014-12-30 DIAGNOSIS — G4730 Sleep apnea, unspecified: Secondary | ICD-10-CM

## 2015-04-12 ENCOUNTER — Ambulatory Visit (HOSPITAL_BASED_OUTPATIENT_CLINIC_OR_DEPARTMENT_OTHER): Payer: Self-pay | Attending: Internal Medicine | Admitting: Sleep Medicine

## 2015-04-12 VITALS — Ht 67.0 in | Wt 170.0 lb

## 2015-04-12 DIAGNOSIS — G4730 Sleep apnea, unspecified: Secondary | ICD-10-CM | POA: Insufficient documentation

## 2015-04-12 DIAGNOSIS — G4733 Obstructive sleep apnea (adult) (pediatric): Secondary | ICD-10-CM | POA: Insufficient documentation

## 2015-04-12 DIAGNOSIS — G4710 Hypersomnia, unspecified: Secondary | ICD-10-CM | POA: Insufficient documentation

## 2015-04-12 DIAGNOSIS — Z79899 Other long term (current) drug therapy: Secondary | ICD-10-CM | POA: Insufficient documentation

## 2015-04-12 DIAGNOSIS — R0683 Snoring: Secondary | ICD-10-CM | POA: Insufficient documentation

## 2015-04-18 NOTE — Sleep Study (Signed)
  [facility] Geana Walts A. Gerilyn Pilgrim, MD     [email or web address]             NOCTURNAL POLYSOMNOGRAPHY   LOCATION: [facility]  Patient Name: Stephen Sanders, Stephen Sanders Date: 04/12/2015 Gender: Male D.O.B: 04/18/1965 Age (years): 30 Referring Provider: Not Available Height (inches): 67 Interpreting Physician: Beryle Beams MD, ABSM Weight (lbs): 170 RPSGT: Alfonso Ellis BMI: 27 MRN: [id number] Neck Size: 16.50 CLINICAL INFORMATION Sleep Study Type: NPSG Indication for sleep study: N/A Epworth Sleepiness Score: 20 SLEEP STUDY TECHNIQUE As per the AASM Manual for the Scoring of Sleep and Associated Events v2.3 (April 2016) with a hypopnea requiring 4% desaturations. The channels recorded and monitored were frontal, central and occipital EEG, electrooculogram (EOG), submentalis EMG (chin), nasal and oral airflow, thoracic and abdominal wall motion, anterior tibialis EMG, snore microphone, electrocardiogram, and pulse oximetry. MEDICATIONS Patient's medications include: N/A. Medications self-administered by patient during sleep study : No sleep medicine administered. Prior to Admission medications   Medication Sig Start Date End Date Taking? Authorizing Provider  chlorpheniramine-HYDROcodone (TUSSIONEX PENNKINETIC ER) 10-8 MG/5ML LQCR Take 5 mLs by mouth every 12 (twelve) hours. 02/26/12   Ivery Quale, PA-C  ibuprofen (ADVIL,MOTRIN) 200 MG tablet Take 200 mg by mouth every 6 (six) hours as needed. For pain    Historical Provider, MD  ketorolac (TORADOL) 10 MG tablet Take 1 tablet (10 mg total) by mouth every 6 (six) hours as needed for pain. 03/10/12   Donnetta Hutching, MD  oxyCODONE-acetaminophen (PERCOCET/ROXICET) 5-325 MG per tablet Take 1-2 tablets by mouth every 6 (six) hours as needed for pain. 03/10/12   Donnetta Hutching, MD  Tamsulosin HCl (FLOMAX) 0.4 MG CAPS Take 1 capsule (0.4 mg total) by mouth daily. 03/10/12   Donnetta Hutching, MD    SLEEP ARCHITECTURE The study was initiated  at 10:57:42 PM and ended at 5:16:29 AM. Sleep onset time was 2.2 minutes and the sleep efficiency was 92.5%. The total sleep time was 350.5 minutes. Stage REM latency was 271.5 minutes. The patient spent 2.71% of the night in stage N1 sleep, 65.76% in stage N2 sleep, 26.39% in stage N3 and 5.14% in REM. Alpha intrusion was absent. Supine sleep was 100.00%. RESPIRATORY PARAMETERS The overall apnea/hypopnea index (AHI) was 19.5 per hour. There were 24 total apneas, including 20 obstructive, 4 central and 0 mixed apneas. There were 90 hypopneas and 0 RERAs. The AHI during Stage REM sleep was 16.7 per hour. AHI while supine was 19.5 per hour. The mean oxygen saturation was 96.05%. The minimum SpO2 during sleep was 81.00%. Loud snoring was noted during this study. CARDIAC DATA The 2 lead EKG demonstrated sinus rhythm. The mean heart rate was N/A beats per minute. Other EKG findings include: None. LEG MOVEMENT DATA The total PLMS were 0 with a resulting PLMS index of 0.00. Associated arousal with leg movement index was 0.0.  IMPRESSIONS - Moderate obstructive sleep apnea. Suggest AutoPap 8-12.   Argie Ramming, MD Diplomate, American Board of Sleep Medicine.

## 2017-04-05 ENCOUNTER — Encounter (HOSPITAL_COMMUNITY): Payer: Self-pay | Admitting: Emergency Medicine

## 2017-04-05 ENCOUNTER — Other Ambulatory Visit: Payer: Self-pay

## 2017-04-05 ENCOUNTER — Emergency Department (HOSPITAL_COMMUNITY)
Admission: EM | Admit: 2017-04-05 | Discharge: 2017-04-05 | Disposition: A | Discharge status: Home / Self Care | Payer: Managed Care, Other (non HMO) | Attending: Emergency Medicine | Admitting: Emergency Medicine

## 2017-04-05 DIAGNOSIS — S61215A Laceration without foreign body of left ring finger without damage to nail, initial encounter: Secondary | ICD-10-CM

## 2017-04-05 DIAGNOSIS — Y93G1 Activity, food preparation and clean up: Secondary | ICD-10-CM | POA: Diagnosis not present

## 2017-04-05 DIAGNOSIS — Y990 Civilian activity done for income or pay: Secondary | ICD-10-CM | POA: Insufficient documentation

## 2017-04-05 DIAGNOSIS — Y929 Unspecified place or not applicable: Secondary | ICD-10-CM | POA: Insufficient documentation

## 2017-04-05 DIAGNOSIS — Z79899 Other long term (current) drug therapy: Secondary | ICD-10-CM | POA: Diagnosis not present

## 2017-04-05 DIAGNOSIS — S61315A Laceration without foreign body of left ring finger with damage to nail, initial encounter: Secondary | ICD-10-CM | POA: Insufficient documentation

## 2017-04-05 DIAGNOSIS — I10 Essential (primary) hypertension: Secondary | ICD-10-CM | POA: Diagnosis not present

## 2017-04-05 DIAGNOSIS — W3182XA Contact with other commercial machinery, initial encounter: Secondary | ICD-10-CM | POA: Insufficient documentation

## 2017-04-05 HISTORY — DX: Essential (primary) hypertension: I10

## 2017-04-05 MED ORDER — LIDOCAINE HCL (PF) 1 % IJ SOLN
5.0000 mL | Freq: Once | INTRAMUSCULAR | Status: AC
  Administered 2017-04-05: 5 mL
  Filled 2017-04-05: qty 6

## 2017-04-05 MED ORDER — POVIDONE-IODINE 10 % EX SOLN
CUTANEOUS | Status: AC
  Filled 2017-04-05: qty 15

## 2017-04-05 NOTE — ED Notes (Signed)
Pt alert & oriented x4, stable gait. Patient  given discharge instructions, paperwork & prescription(s). Patient verbalized understanding. Pt left department w/ no further questions. 

## 2017-04-05 NOTE — ED Triage Notes (Signed)
Patient cut his L ring finger with a meat slicer at work.

## 2017-04-05 NOTE — ED Notes (Signed)
Bandage applied to finger & work note provided.

## 2017-04-05 NOTE — ED Provider Notes (Signed)
[facility] EMERGENCY DEPARTMENT Provider Note   CSN: [id number] Arrival date & time: 04/05/17  1814     History   Chief Complaint Chief Complaint  Patient presents with  . Laceration    HPI Stephen Sanders is a [age] y.o. male.  The history is provided by the patient. No language interpreter was used.  Laceration   The incident occurred less than 1 hour ago. The laceration is located on the left hand. The laceration is 1 cm in size. The laceration mechanism was a a metal edge. The pain is moderate. The pain has been constant since onset. Possible foreign bodies include metal. His tetanus status is UTD.   Pt cut his finger on a meat slicer. Past Medical History:  Diagnosis Date  . Hypertension   . Renal disorder     Patient Active Problem List   Diagnosis Date Noted  . ANXIETY 05/12/2006    Past Surgical History:  Procedure Laterality Date  . VASECTOMY         Home Medications    Prior to Admission medications   Medication Sig Start Date End Date Taking? Authorizing Provider  chlorpheniramine-HYDROcodone (TUSSIONEX PENNKINETIC ER) 10-8 MG/5ML LQCR Take 5 mLs by mouth every 12 (twelve) hours. 02/26/12   Ivery QualeBryant, Hobson, PA-C  ibuprofen (ADVIL,MOTRIN) 200 MG tablet Take 200 mg by mouth every 6 (six) hours as needed. For pain    [provider]  ketorolac (TORADOL) 10 MG tablet Take 1 tablet (10 mg total) by mouth every 6 (six) hours as needed for pain. 03/10/12   Donnetta Hutchingook, Brian, MD  oxyCODONE-acetaminophen (PERCOCET/ROXICET) 5-325 MG per tablet Take 1-2 tablets by mouth every 6 (six) hours as needed for pain. 03/10/12   Donnetta Hutchingook, Brian, MD  Tamsulosin HCl (FLOMAX) 0.4 MG CAPS Take 1 capsule (0.4 mg total) by mouth daily. 03/10/12   Donnetta Hutchingook, Brian, MD    Family History Family History  Problem Relation Age of Onset  . Anemia Mother   . Heart attack Father     Social History Social History   Tobacco Use  . Smoking status: Never Smoker  . Smokeless tobacco:  Never Used  Substance Use Topics  . Alcohol use: No  . Drug use: No     Allergies   Patient has no known allergies.   Review of Systems Review of Systems  All other systems reviewed and are negative.    Physical Exam Updated Vital Signs BP (!) 150/110 (BP Location: Right Arm)   Pulse 74   Temp 98.1 F (36.7 C) (Oral)   Resp 18   Ht 5\' 7"  (1.702 m)   Wt 77.1 kg (170 lb)   SpO2 100%   BMI 26.63 kg/m   Physical Exam  Constitutional: He is oriented to person, place, and time. He appears well-developed and well-nourished.  HENT:  Head: Normocephalic.  Musculoskeletal: Normal range of motion.  Neurological: He is alert and oriented to person, place, and time.  Skin: Skin is warm.  1cl laceration thru nail.  Gapping nv and ns intact  Psychiatric: He has a normal mood and affect.  Nursing note and vitals reviewed.    ED Treatments / Results  Labs (all labs ordered are listed, but only abnormal results are displayed) Labs Reviewed - No data to display  EKG  EKG Interpretation None       Radiology No results found.  Procedures .Marland Kitchen.Laceration Repair Date/Time: 04/05/2017 8:41 PM Performed by: Elson AreasSofia, Leslie K, PA-C Authorized by: Cheron SchaumannSofia, Leslie  K, PA-C   Consent:    Consent obtained:  Verbal   Consent given by:  Patient   Risks discussed:  Infection Anesthesia (see MAR for exact dosages):    Anesthesia method:  Local infiltration Laceration details:    Length (cm):  1 Repair type:    Repair type:  Simple Exploration:    Contaminated: no   Treatment:    Area cleansed with:  Betadine   Amount of cleaning:  Standard   Irrigation solution:  Sterile saline   Visualized foreign bodies/material removed: no   Skin repair:    Repair method:  Sutures   Suture size:  5-0   Suture material:  Prolene   Suture technique:  Simple interrupted   Number of sutures:  3 Approximation:    Approximation:  Loose Post-procedure details:    Patient tolerance of  procedure:  Tolerated with difficulty Comments:     Minimal numbing with lidocaine.  I did not remove corner of nail.  I placed a small suture through to promote nail be healing.    (including critical care time)  Medications Ordered in ED Medications  povidone-iodine (BETADINE) 10 % external solution (not administered)  lidocaine (PF) (XYLOCAINE) 1 % injection 5 mL (5 mLs Infiltration Given by Other 04/05/17 2006)     Initial Impression / Assessment and Plan / ED Course  I have reviewed the triage vital signs and the nursing notes.  Pertinent labs & imaging results that were available during my care of the patient were reviewed by me and considered in my medical decision making (see chart for details).     Suture removal in 8 days  Final Clinical Impressions(s) / ED Diagnoses   Final diagnoses:  Laceration of left ring finger, foreign body presence unspecified, nail damage status unspecified, initial encounter    ED Discharge Orders    None    An After Visit Summary was printed and given to the patient.    Osie Cheeks 04/05/17 2043    Mancel Bale, MD 04/05/17 [id number]

## 2017-04-05 NOTE — Discharge Instructions (Signed)
Suture removal in 8 days  °

## 2018-02-20 ENCOUNTER — Emergency Department (HOSPITAL_COMMUNITY)
Admission: EM | Admit: 2018-02-20 | Discharge: 2018-02-20 | Disposition: A | Discharge status: Home / Self Care | Payer: Managed Care, Other (non HMO) | Attending: Emergency Medicine | Admitting: Emergency Medicine

## 2018-02-20 ENCOUNTER — Emergency Department (HOSPITAL_COMMUNITY): Payer: Managed Care, Other (non HMO)

## 2018-02-20 ENCOUNTER — Encounter (HOSPITAL_COMMUNITY): Payer: Self-pay | Admitting: Emergency Medicine

## 2018-02-20 ENCOUNTER — Other Ambulatory Visit: Payer: Self-pay

## 2018-02-20 DIAGNOSIS — Y999 Unspecified external cause status: Secondary | ICD-10-CM | POA: Insufficient documentation

## 2018-02-20 DIAGNOSIS — W2209XA Striking against other stationary object, initial encounter: Secondary | ICD-10-CM | POA: Diagnosis not present

## 2018-02-20 DIAGNOSIS — Y939 Activity, unspecified: Secondary | ICD-10-CM | POA: Insufficient documentation

## 2018-02-20 DIAGNOSIS — S93601A Unspecified sprain of right foot, initial encounter: Secondary | ICD-10-CM

## 2018-02-20 DIAGNOSIS — Z79899 Other long term (current) drug therapy: Secondary | ICD-10-CM | POA: Insufficient documentation

## 2018-02-20 DIAGNOSIS — Y929 Unspecified place or not applicable: Secondary | ICD-10-CM | POA: Diagnosis not present

## 2018-02-20 DIAGNOSIS — S9031XA Contusion of right foot, initial encounter: Secondary | ICD-10-CM | POA: Insufficient documentation

## 2018-02-20 DIAGNOSIS — I10 Essential (primary) hypertension: Secondary | ICD-10-CM | POA: Insufficient documentation

## 2018-02-20 MED ORDER — IBUPROFEN 600 MG PO TABS: 600.0000 mg | ORAL_TABLET | Freq: Four times a day (QID) | ORAL | 0 refills | Status: DC | PRN

## 2018-02-20 NOTE — ED Notes (Signed)
ED Provider at bedside. 

## 2018-02-20 NOTE — ED Triage Notes (Signed)
Pt c/o right foot pain after tripping and falling last night. Pt states when he fell it hit the walkway.

## 2018-02-20 NOTE — ED Provider Notes (Signed)
[facility] EMERGENCY DEPARTMENT Provider Note   CSN: [id number] Arrival date & time: 02/20/18  1836     History   Chief Complaint Chief Complaint  Patient presents with  . Foot Pain    HPI Stephen Sanders is a 52 y.o. male.  The history is provided by the patient. No language interpreter was used.  Foot Pain  This is a new problem. The current episode started yesterday. The problem occurs constantly. The problem has been gradually worsening. Nothing aggravates the symptoms. Nothing relieves the symptoms. He has tried nothing for the symptoms. The treatment provided no relief.  Pt reports he hit wall with his foot.  Pt complains of increasing pain   Past Medical History:  Diagnosis Date  . Hypertension   . Renal disorder    kidney stones    Patient Active Problem List   Diagnosis Date Noted  . ANXIETY 05/12/2006    Past Surgical History:  Procedure Laterality Date  . VASECTOMY          Home Medications    Prior to Admission medications   Medication Sig Start Date End Date Taking? Authorizing Provider  chlorpheniramine-HYDROcodone (TUSSIONEX PENNKINETIC ER) 10-8 MG/5ML LQCR Take 5 mLs by mouth every 12 (twelve) hours. 02/26/12   Ivery Quale, PA-C  ibuprofen (ADVIL,MOTRIN) 600 MG tablet Take 1 tablet (600 mg total) by mouth every 6 (six) hours as needed. 02/20/18   Elson Areas, PA-C  ketorolac (TORADOL) 10 MG tablet Take 1 tablet (10 mg total) by mouth every 6 (six) hours as needed for pain. 03/10/12   Donnetta Hutching, MD  oxyCODONE-acetaminophen (PERCOCET/ROXICET) 5-325 MG per tablet Take 1-2 tablets by mouth every 6 (six) hours as needed for pain. 03/10/12   Donnetta Hutching, MD  Tamsulosin HCl (FLOMAX) 0.4 MG CAPS Take 1 capsule (0.4 mg total) by mouth daily. 03/10/12   Donnetta Hutching, MD    Family History Family History  Problem Relation Age of Onset  . Anemia Mother   . Heart attack Father     Social History Social History   Tobacco Use  . Smoking  status: Never Smoker  . Smokeless tobacco: Never Used  Substance Use Topics  . Alcohol use: No  . Drug use: No     Allergies   Patient has no known allergies.   Review of Systems Review of Systems  All other systems reviewed and are negative.    Physical Exam Updated Vital Signs BP (!) 161/73   Pulse 91   Temp 97.9 F (36.6 C) (Oral)   Resp 17   Ht 5\' 9"  (1.753 m)   Wt 77.1 kg   SpO2 98%   BMI 25.10 kg/m   Physical Exam  Constitutional: He appears well-developed and well-nourished.  HENT:  Head: Normocephalic and atraumatic.  Eyes: Conjunctivae are normal.  Neck: Neck supple.  Cardiovascular: Normal rate and regular rhythm.  Pulmonary/Chest: Effort normal. No respiratory distress.  Musculoskeletal: He exhibits no edema.  Tender foot to palpation, distal  Pain with movement, nv and ns intact   Neurological: He is alert.  Skin: Skin is warm and dry.  Psychiatric: He has a normal mood and affect.  Nursing note and vitals reviewed.    ED Treatments / Results  Labs (all labs ordered are listed, but only abnormal results are displayed) Labs Reviewed - No data to display  EKG None  Radiology Dg Foot Complete Right  Result Date: 02/20/2018 CLINICAL DATA:  Fall. EXAM: RIGHT FOOT COMPLETE -  3+ VIEW COMPARISON:  None. FINDINGS: There is no evidence of fracture or dislocation. No acute soft tissue finding. Well-circumscribed round lucency with sclerotic margin in the calcaneus, likely intraosseous lipoma. Bunion formation. IMPRESSION: No acute finding. Electronically Signed   By: Marnee SpringJonathon  Watts M.D.   On: 02/20/2018 19:36    Procedures Procedures (including critical care time)  Medications Ordered in ED Medications - No data to display   Initial Impression / Assessment and Plan / ED Course  I have reviewed the triage vital signs and the nursing notes.  Pertinent labs & imaging results that were available during my care of the patient were reviewed by me  and considered in my medical decision making (see chart for details).     MDM  xrays discussed with pt.  Pt advised to follow up if pain persit.  Pt placed in an ace wrap and post op shoe.   Final Clinical Impressions(s) / ED Diagnoses   Final diagnoses:  Contusion of right foot, initial encounter  Sprain of right foot, initial encounter    ED Discharge Orders         Ordered    ibuprofen (ADVIL,MOTRIN) 600 MG tablet  Every 6 hours PRN     02/20/18 2015        An After Visit Summary was printed and given to the patient.    Elson AreasSofia, Monya Kozakiewicz K, Cordelia Poche-C 02/20/18 2221    Derwood KaplanNanavati, Ankit, MD 02/20/18 [phone]

## 2018-05-15 ENCOUNTER — Emergency Department (HOSPITAL_COMMUNITY)
Admission: EM | Admit: 2018-05-15 | Discharge: 2018-05-15 | Disposition: A | Discharge status: Home / Self Care | Payer: Managed Care, Other (non HMO) | Attending: Emergency Medicine | Admitting: Emergency Medicine

## 2018-05-15 ENCOUNTER — Other Ambulatory Visit: Payer: Self-pay

## 2018-05-15 ENCOUNTER — Encounter (HOSPITAL_COMMUNITY): Payer: Self-pay | Admitting: Emergency Medicine

## 2018-05-15 DIAGNOSIS — W458XXA Other foreign body or object entering through skin, initial encounter: Secondary | ICD-10-CM | POA: Diagnosis not present

## 2018-05-15 DIAGNOSIS — Y990 Civilian activity done for income or pay: Secondary | ICD-10-CM | POA: Insufficient documentation

## 2018-05-15 DIAGNOSIS — Y9259 Other trade areas as the place of occurrence of the external cause: Secondary | ICD-10-CM | POA: Insufficient documentation

## 2018-05-15 DIAGNOSIS — I10 Essential (primary) hypertension: Secondary | ICD-10-CM | POA: Diagnosis not present

## 2018-05-15 DIAGNOSIS — Z79899 Other long term (current) drug therapy: Secondary | ICD-10-CM | POA: Diagnosis not present

## 2018-05-15 DIAGNOSIS — S0502XA Injury of conjunctiva and corneal abrasion without foreign body, left eye, initial encounter: Secondary | ICD-10-CM | POA: Diagnosis not present

## 2018-05-15 DIAGNOSIS — Y939 Activity, unspecified: Secondary | ICD-10-CM | POA: Diagnosis not present

## 2018-05-15 DIAGNOSIS — H579 Unspecified disorder of eye and adnexa: Secondary | ICD-10-CM | POA: Diagnosis present

## 2018-05-15 MED ORDER — TETRACAINE HCL 0.5 % OP SOLN
2.0000 [drp] | Freq: Once | OPHTHALMIC | Status: AC
  Administered 2018-05-15: 2 [drp] via OPHTHALMIC
  Filled 2018-05-15: qty 4

## 2018-05-15 MED ORDER — FLUORESCEIN SODIUM 1 MG OP STRP
1.0000 | ORAL_STRIP | Freq: Once | OPHTHALMIC | Status: AC
  Administered 2018-05-15: 1 via OPHTHALMIC
  Filled 2018-05-15: qty 1

## 2018-05-15 MED ORDER — ERYTHROMYCIN 5 MG/GM OP OINT
TOPICAL_OINTMENT | Freq: Once | OPHTHALMIC | Status: AC
  Administered 2018-05-15: 18:00:00 via OPHTHALMIC
  Filled 2018-05-15: qty 3.5
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
## 2018-05-15 NOTE — ED Notes (Addendum)
Right Eye 20/25 Left Eye 20/50 Bilateral Eye 20/25

## 2018-05-15 NOTE — Discharge Instructions (Signed)
Apply the antibiotic ointment to your left eye 4 times daily for the next 7 days.

## 2018-05-30 ENCOUNTER — Other Ambulatory Visit: Payer: Self-pay

## 2019-03-12 ENCOUNTER — Other Ambulatory Visit (HOSPITAL_COMMUNITY): Payer: Self-pay | Admitting: Internal Medicine

## 2019-03-12 ENCOUNTER — Other Ambulatory Visit: Payer: Self-pay | Admitting: Internal Medicine

## 2019-03-12 DIAGNOSIS — E049 Nontoxic goiter, unspecified: Secondary | ICD-10-CM

## 2019-04-05 ENCOUNTER — Other Ambulatory Visit: Payer: Self-pay

## 2019-04-05 ENCOUNTER — Ambulatory Visit (HOSPITAL_COMMUNITY)
Admission: RE | Admit: 2019-04-05 | Discharge: 2019-04-05 | Disposition: A | Discharge status: Home / Self Care | Payer: Managed Care, Other (non HMO) | Source: Ambulatory Visit | Attending: Internal Medicine | Admitting: Internal Medicine

## 2019-04-05 DIAGNOSIS — E049 Nontoxic goiter, unspecified: Secondary | ICD-10-CM | POA: Diagnosis not present

## 2019-08-20 DIAGNOSIS — J069 Acute upper respiratory infection, unspecified: Secondary | ICD-10-CM | POA: Diagnosis not present

## 2020-02-01 DIAGNOSIS — I10 Essential (primary) hypertension: Secondary | ICD-10-CM | POA: Diagnosis not present

## 2020-02-11 ENCOUNTER — Other Ambulatory Visit: Payer: Self-pay

## 2020-02-11 ENCOUNTER — Ambulatory Visit: Payer: Self-pay

## 2020-02-11 ENCOUNTER — Other Ambulatory Visit: Payer: Self-pay | Admitting: Occupational Medicine

## 2020-02-11 DIAGNOSIS — M79641 Pain in right hand: Secondary | ICD-10-CM

## 2020-03-05 DIAGNOSIS — E663 Overweight: Secondary | ICD-10-CM | POA: Diagnosis not present

## 2020-03-05 DIAGNOSIS — Z6827 Body mass index (BMI) 27.0-27.9, adult: Secondary | ICD-10-CM | POA: Diagnosis not present

## 2020-03-05 DIAGNOSIS — K219 Gastro-esophageal reflux disease without esophagitis: Secondary | ICD-10-CM | POA: Diagnosis not present

## 2020-03-05 DIAGNOSIS — N5089 Other specified disorders of the male genital organs: Secondary | ICD-10-CM | POA: Diagnosis not present

## 2020-03-12 DIAGNOSIS — I10 Essential (primary) hypertension: Secondary | ICD-10-CM | POA: Diagnosis not present

## 2020-03-12 DIAGNOSIS — F5221 Male erectile disorder: Secondary | ICD-10-CM | POA: Diagnosis not present

## 2020-03-12 DIAGNOSIS — H6123 Impacted cerumen, bilateral: Secondary | ICD-10-CM | POA: Diagnosis not present

## 2020-05-20 DIAGNOSIS — Z1211 Encounter for screening for malignant neoplasm of colon: Secondary | ICD-10-CM | POA: Diagnosis not present

## 2020-05-20 DIAGNOSIS — Z1212 Encounter for screening for malignant neoplasm of rectum: Secondary | ICD-10-CM | POA: Diagnosis not present

## 2020-05-29 LAB — COLOGUARD
COLOGUARD: NEGATIVE
Cologuard: NEGATIVE

## 2021-02-23 DIAGNOSIS — I10 Essential (primary) hypertension: Secondary | ICD-10-CM | POA: Diagnosis not present

## 2021-03-13 DIAGNOSIS — I10 Essential (primary) hypertension: Secondary | ICD-10-CM | POA: Diagnosis not present

## 2021-03-18 DIAGNOSIS — Z23 Encounter for immunization: Secondary | ICD-10-CM | POA: Diagnosis not present

## 2021-03-18 DIAGNOSIS — Z0001 Encounter for general adult medical examination with abnormal findings: Secondary | ICD-10-CM | POA: Diagnosis not present

## 2021-10-26 IMAGING — DX DG HAND COMPLETE 3+V*R*
3 series · 3 of 3 positions shown · non-contrast
Comparison: None.

CLINICAL DATA: Crush injury third and fourth digits right hand,
small laceration

EXAM:
RIGHT HAND - COMPLETE 3+ VIEW

[hand pa]
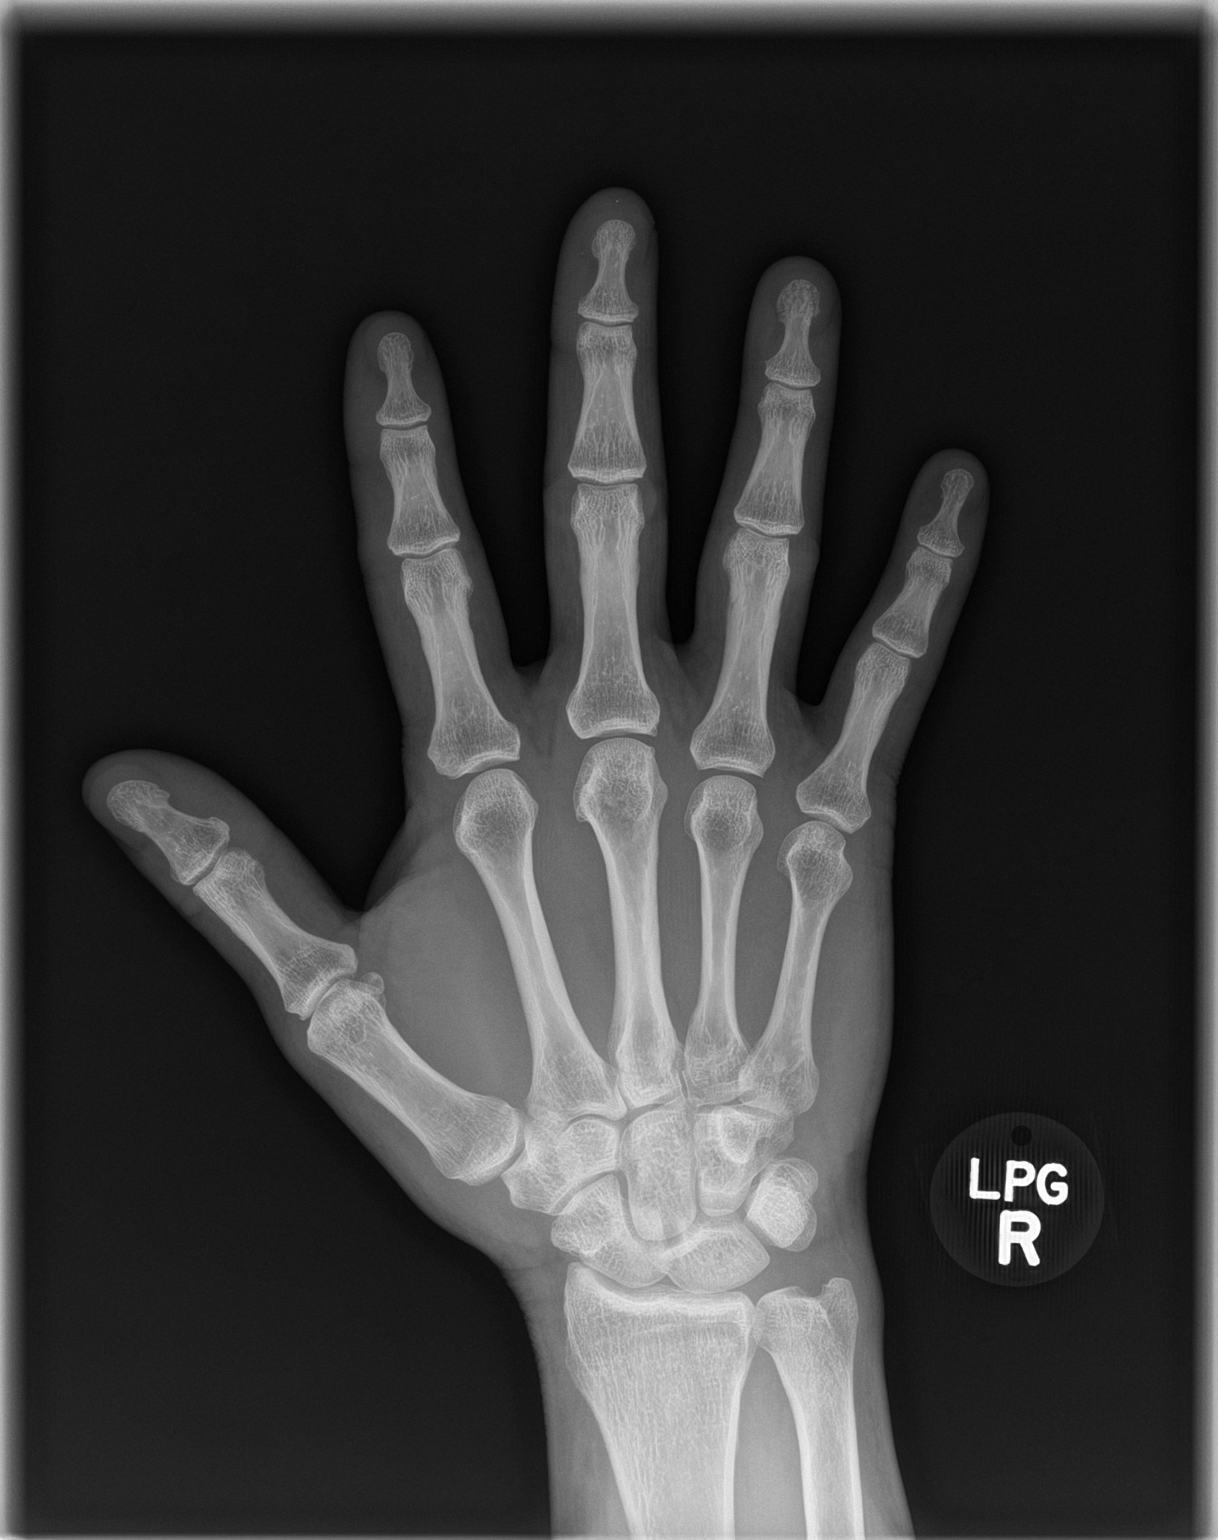

[hand obl]
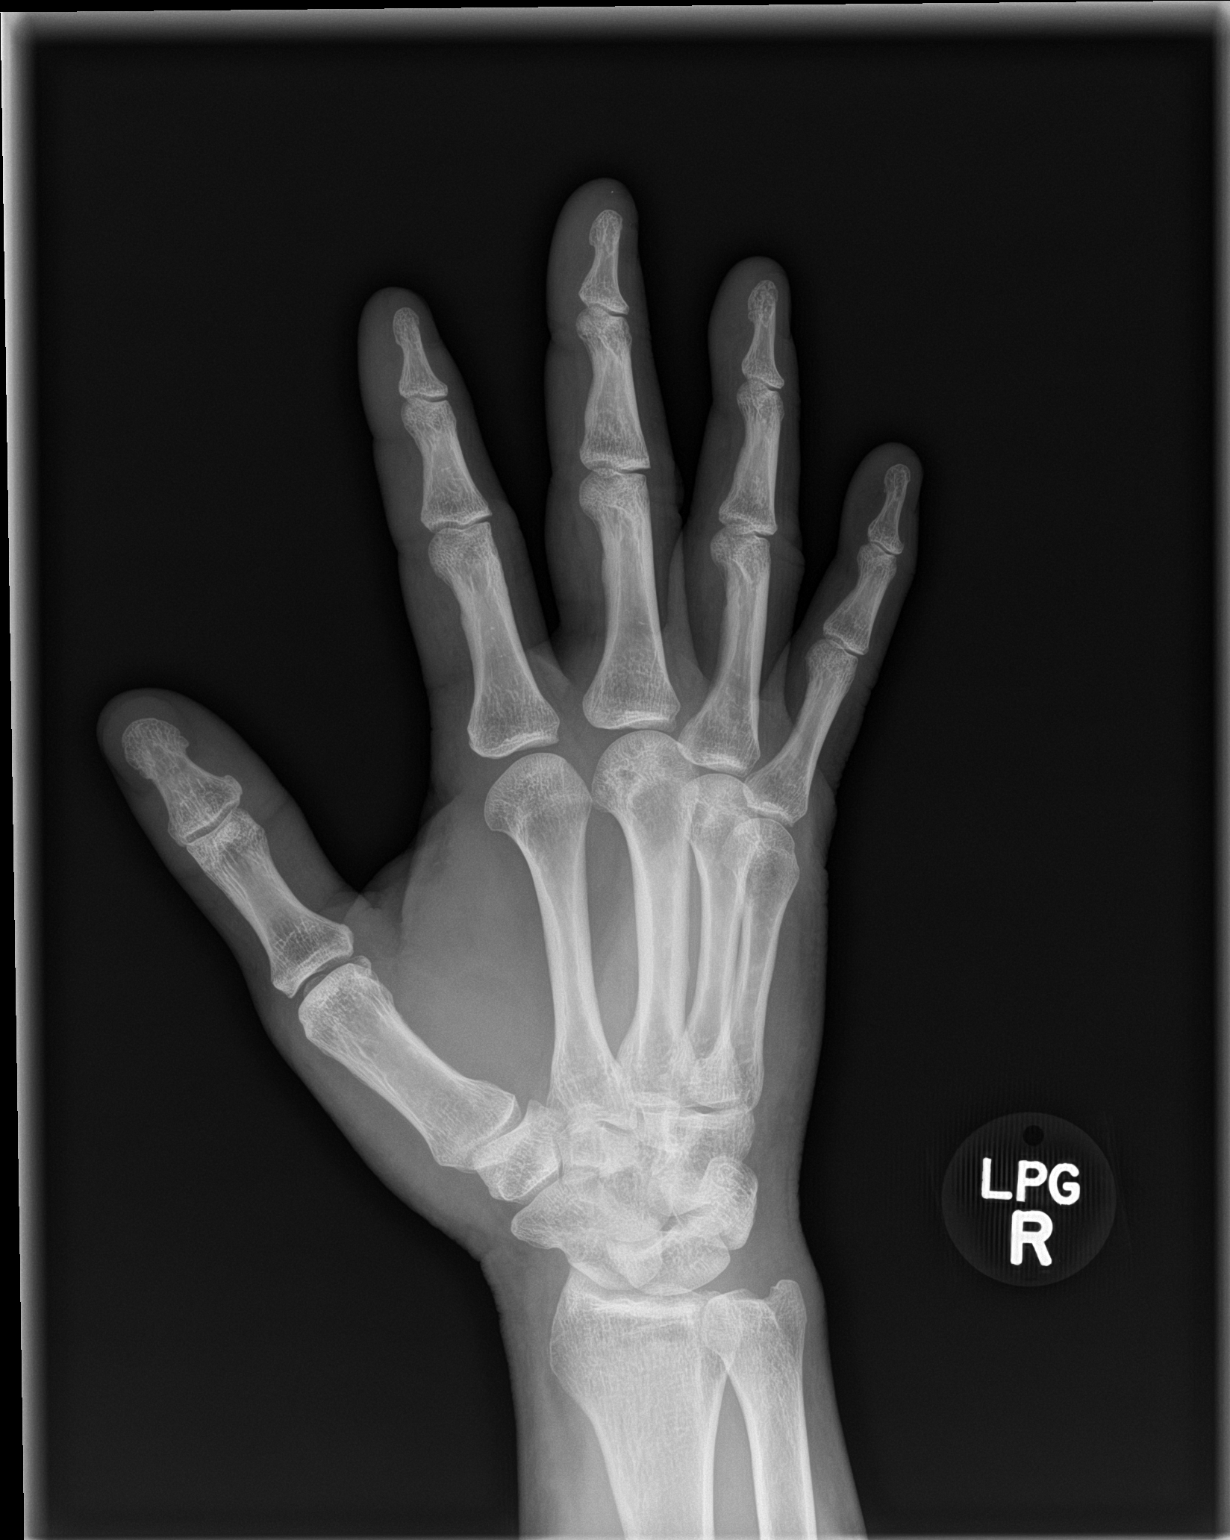

[hand lat]
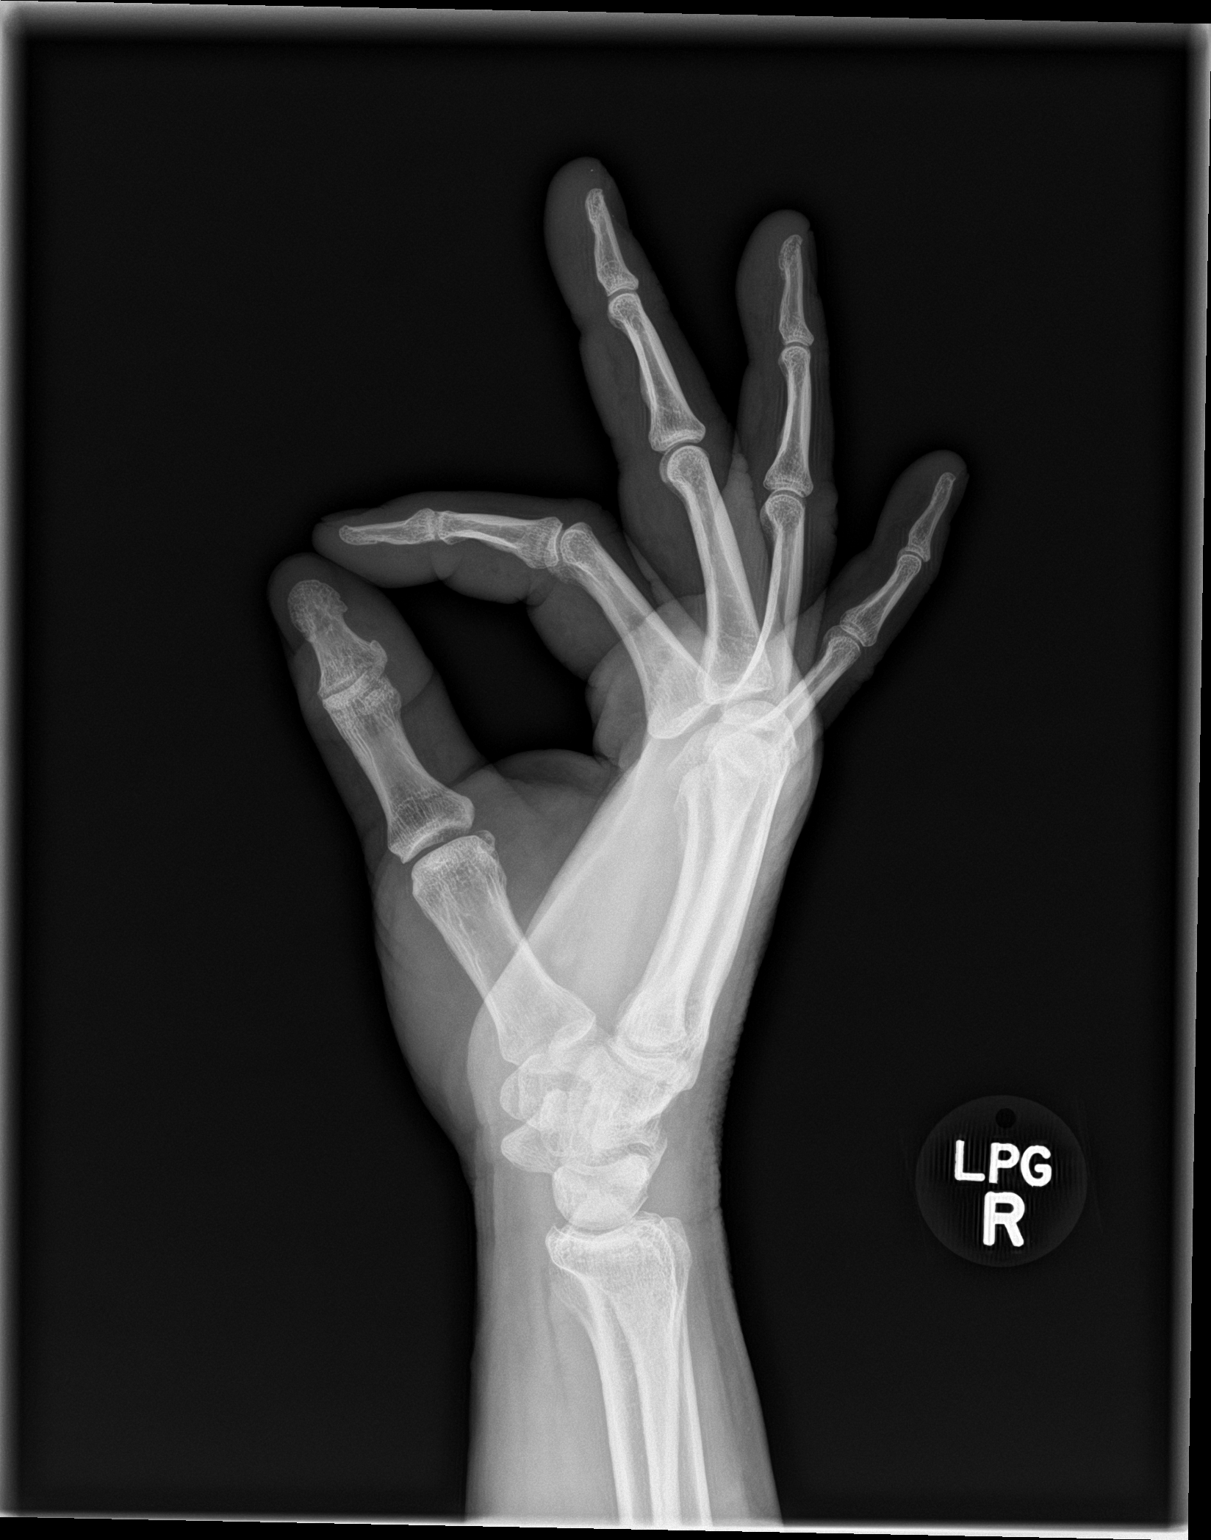

[3 of 3 positions shown; findings below may reference images not displayed]

FINDINGS: Frontal, oblique, lateral views of the right hand are obtained.
There is a minimally displaced fracture through the distal tuft of
the fourth distal phalanx. No other acute displaced fractures. There
are punctate radiodensities within the soft tissues of the distal
tuft of the third digit, age indeterminate. Mild soft tissue
swelling distal aspect third and fourth digits. No other acute bony
abnormalities. Joint spaces are well preserved.
IMPRESSION: 1. Minimally displaced fracture distal tuft fourth distal phalanx.
2. Punctate radiopaque foreign bodies distal margin third digit, age
indeterminate.
3. Soft tissue swelling distal aspect third and fourth digits.

## 2022-02-12 DIAGNOSIS — Z23 Encounter for immunization: Secondary | ICD-10-CM | POA: Diagnosis not present

## 2022-02-22 DIAGNOSIS — E663 Overweight: Secondary | ICD-10-CM | POA: Diagnosis not present

## 2022-02-22 DIAGNOSIS — J069 Acute upper respiratory infection, unspecified: Secondary | ICD-10-CM | POA: Diagnosis not present

## 2022-02-22 DIAGNOSIS — Z6828 Body mass index (BMI) 28.0-28.9, adult: Secondary | ICD-10-CM | POA: Diagnosis not present

## 2022-02-22 DIAGNOSIS — R030 Elevated blood-pressure reading, without diagnosis of hypertension: Secondary | ICD-10-CM | POA: Diagnosis not present

## 2022-03-05 DIAGNOSIS — J209 Acute bronchitis, unspecified: Secondary | ICD-10-CM | POA: Diagnosis not present

## 2022-03-22 DIAGNOSIS — I10 Essential (primary) hypertension: Secondary | ICD-10-CM | POA: Diagnosis not present

## 2022-03-24 DIAGNOSIS — Z6830 Body mass index (BMI) 30.0-30.9, adult: Secondary | ICD-10-CM | POA: Diagnosis not present

## 2022-03-24 DIAGNOSIS — J209 Acute bronchitis, unspecified: Secondary | ICD-10-CM | POA: Diagnosis not present

## 2022-03-24 DIAGNOSIS — R030 Elevated blood-pressure reading, without diagnosis of hypertension: Secondary | ICD-10-CM | POA: Diagnosis not present

## 2022-03-24 DIAGNOSIS — E669 Obesity, unspecified: Secondary | ICD-10-CM | POA: Diagnosis not present

## 2022-03-30 ENCOUNTER — Other Ambulatory Visit (HOSPITAL_COMMUNITY): Payer: Self-pay | Admitting: Internal Medicine

## 2022-03-30 DIAGNOSIS — E663 Overweight: Secondary | ICD-10-CM | POA: Diagnosis not present

## 2022-03-30 DIAGNOSIS — N529 Male erectile dysfunction, unspecified: Secondary | ICD-10-CM | POA: Diagnosis not present

## 2022-03-30 DIAGNOSIS — F5221 Male erectile disorder: Secondary | ICD-10-CM | POA: Diagnosis not present

## 2022-03-30 DIAGNOSIS — I10 Essential (primary) hypertension: Secondary | ICD-10-CM | POA: Diagnosis not present

## 2022-03-30 DIAGNOSIS — R7301 Impaired fasting glucose: Secondary | ICD-10-CM | POA: Diagnosis not present

## 2022-03-30 DIAGNOSIS — R1032 Left lower quadrant pain: Secondary | ICD-10-CM

## 2022-03-30 DIAGNOSIS — J069 Acute upper respiratory infection, unspecified: Secondary | ICD-10-CM | POA: Diagnosis not present

## 2022-03-30 DIAGNOSIS — Z0001 Encounter for general adult medical examination with abnormal findings: Secondary | ICD-10-CM | POA: Diagnosis not present

## 2022-04-02 DIAGNOSIS — R7301 Impaired fasting glucose: Secondary | ICD-10-CM | POA: Diagnosis not present

## 2022-04-02 DIAGNOSIS — F5221 Male erectile disorder: Secondary | ICD-10-CM | POA: Diagnosis not present

## 2022-04-08 ENCOUNTER — Ambulatory Visit (HOSPITAL_COMMUNITY): Admission: RE | Admit: 2022-04-08 | Payer: BC Managed Care – PPO | Source: Ambulatory Visit

## 2022-04-08 ENCOUNTER — Encounter (HOSPITAL_COMMUNITY): Payer: Self-pay

## 2022-04-11 LAB — LAB REPORT - SCANNED
A1c: 6
Albumin, Urine POC: 4.6
Creatinine, POC: 210 mg/dL
EGFR: 94
Microalb Creat Ratio: 2

## 2022-08-22 NOTE — Progress Notes (Unsigned)
   Subjective:  Patient ID: Stephen Sanders, male    DOB: [birth year], 57 y.o.   MRN: [id number]  CC: New Patient  HPI:  Stephen Sanders is a very pleasant 57 y.o. male who presents today to establish care.  PMHx: Past Medical History:  Diagnosis Date   Hypertension    Renal disorder    kidney stones    Surgical Hx: Past Surgical History:  Procedure Laterality Date   VASECTOMY      Family Hx: Family History  Problem Relation Age of Onset   Anemia Mother    Heart attack Father     Social Hx: Current Social History   Who lives at home: *** 08/22/2022  Who would speak for you about health care matters: *** 08/22/2022  Transportation: *** 08/22/2022 Important Relationships & Pets: *** 08/22/2022  Current Stressors: *** 08/22/2022 Work / Education:  *** 08/22/2022 Religious / Personal Beliefs: *** 08/22/2022 Interests / Fun: *** 08/22/2022 Other: *** 08/22/2022   Medications:  Preventative Screening Colonoscopy: year*** results *** Mammogram: year*** results *** Pap test: year*** results *** PSA: year*** results *** DEXA: year*** results *** Tetanus vaccine: year*** results *** Pneumonia vaccine: year*** results *** Shingles vaccine: year*** results *** Heart stress test: year*** results *** Echocardiogram: year*** results *** Xrays: year*** results *** CT/MRI: year*** results ***  Smoking status reviewed  ROS: pertinent noted in the HPI   Objective:  There were no vitals taken for this visit. Vitals and nursing note reviewed  General: NAD, pleasant, able to participate in exam HEENT: normocephalic, TM's visualized bilaterally, no scleral icterus or conjunctival pallor, no nasal discharge, moist mucous membranes, good dentition without erythema or discharge noted in posterior oropharynx Neck: supple, non-tender, without lymphadenopathy Cardiac: RRR, S1 S2 present. normal heart sounds, no murmurs. Respiratory: CTAB, normal effort, No wheezes, rales or  rhonchi Abdomen: Normoactive bowel sounds, non-tender, non-distended, no hepatosplenomegaly Extremities: no edema or cyanosis. Skin: warm and dry, no rashes noted Neuro: alert, no obvious focal deficits Psych: Normal affect and mood  Assessment & Plan:  There are no diagnoses linked to this encounter. No follow-ups on file. Shelby Mattocks, DO 08/22/2022, 2:11 PM PGY-***, [facility]

## 2022-08-23 ENCOUNTER — Encounter: Payer: Self-pay | Admitting: Student

## 2022-08-23 ENCOUNTER — Ambulatory Visit: Payer: BC Managed Care – PPO | Admitting: Student

## 2022-08-23 VITALS — BP 108/79 | HR 81 | Ht 67.0 in | Wt 188.4 lb

## 2022-08-23 DIAGNOSIS — Z23 Encounter for immunization: Secondary | ICD-10-CM | POA: Diagnosis not present

## 2022-08-23 DIAGNOSIS — Z114 Encounter for screening for human immunodeficiency virus [HIV]: Secondary | ICD-10-CM

## 2022-08-23 DIAGNOSIS — Z0000 Encounter for general adult medical examination without abnormal findings: Secondary | ICD-10-CM

## 2022-08-23 DIAGNOSIS — Z1159 Encounter for screening for other viral diseases: Secondary | ICD-10-CM

## 2022-08-23 DIAGNOSIS — I10 Essential (primary) hypertension: Secondary | ICD-10-CM | POA: Diagnosis not present

## 2022-08-23 DIAGNOSIS — Z1211 Encounter for screening for malignant neoplasm of colon: Secondary | ICD-10-CM | POA: Diagnosis not present

## 2022-08-23 DIAGNOSIS — Z131 Encounter for screening for diabetes mellitus: Secondary | ICD-10-CM

## 2022-08-23 DIAGNOSIS — Z13220 Encounter for screening for lipoid disorders: Secondary | ICD-10-CM

## 2022-08-23 NOTE — Assessment & Plan Note (Addendum)
Taking 0.5 tab of benicar 40-25 daily. BP is well controlled to 108/79. Patient says his goal is to come off of BP medication. He also complains that his meds make him pee frequently. York Spaniel that it is hard to come off of BP meds but possible. Mentioned possibly changing his BP meds so that he does not pee so much but no changes today. Follow up as needed.

## 2022-08-23 NOTE — Patient Instructions (Addendum)
It was great to see you today! Thank you for choosing [facility] for your primary care. Stephen Sanders was seen for establishing care.  Today we addressed: I have sent a referral to GI for you may get established for colonoscopy.  We have also given you the Tdap vaccine today.  I recommend signing up for MyChart.  Please make sure you have signed a records release form from your previous PCP.  I am always here if you need anything.  I also would recommend that you get a shingles vaccine at your nearest pharmacy.  We do not unfortunately carry that vaccine here.  If you haven't already, sign up for My Chart to have easy access to your labs results, and communication with your primary care physician.  We are checking some labs today. If they are abnormal, I will call you. If they are normal, I will send you a MyChart message (if it is active) or a letter in the mail. If you do not hear about your labs in the next 2 weeks, please call the office. I recommend that you always bring your medications to each appointment as this makes it easy to ensure you are on the correct medications and helps [place] not miss refills when you need them.  You should return to our clinic Return if symptoms worsen or fail to improve. Please arrive 15 minutes before your appointment to ensure smooth check in process.  We appreciate your efforts in making this happen.  Thank you for allowing me to participate in your care, Shelby Mattocks, DO 08/23/2022, 3:30 PM PGY-2, [facility]

## 2022-08-23 NOTE — Progress Notes (Unsigned)
Subjective:  Patient ID: Stephen Sanders, male    DOB: April 01, 1965, 57 y.o.   MRN: [id number]  CC: New Patient  HPI:  Stephen Sanders is a very pleasant 57 y.o. male who presents today to establish care.  PMHx: Past Medical History:  Diagnosis Date   Hypertension    Renal disorder    kidney stones    Surgical Hx: Past Surgical History:  Procedure Laterality Date   VASECTOMY      Family Hx: Family History  Problem Relation Age of Onset   Anemia Mother    Heart attack Father    Social Hx: Current Social History   Who lives at home: wife and daughter 08/24/2022  Transportation: Risk manager if unable to do so: Wife Work / Programme researcher, broadcasting/film/video:  works as a Clinical research associate for [employer] 08/24/2022 Smoking: never Alcohol: never Other substances: never  Exercise Walk 5-8 miles at work per day  Diet Endorses poor diet: Drinks lots of regular soda, eats whatever  Sleep apnea Doesn't use CPAP, sometimes snores and wakes up but usually sleeps well  Medications: Benicar Pantoprazole (rarely uses) Ibuprofen PRN  Preventative Screening Colonoscopy: never Cologuard 05/20/20 Negative PSA: never Tetanus vaccine: Not in last 10 years Pneumonia vaccine: Never Shingles vaccine: Never Had Holter monitor many years ago for palpitatations, negative, no symptoms since  ROS: pertinent noted in the HPI  No bloody stools or urine, no incontinence or incomplete bladder emptying, no chest pain, no palpitations, no SOB  Objective:  BP 108/79   Pulse 81   Ht 5\' 7"  (1.702 m)   Wt 188 lb 6.4 oz (85.5 kg)   SpO2 99%   BMI 29.51 kg/m  Vitals and nursing note reviewed  General: NAD, pleasant, able to participate in exam HEENT: normocephalic, atraumatic Neck: supple, non-tender, without lymphadenopathy Cardiac: RRR, S1 S2 present. normal heart sounds, no murmurs. Respiratory: CTAB, normal effort, No wheezes, rales or rhonchi Abdomen: Normoactive  bowel sounds, non-tender, non-distended, no hepatosplenomegaly Extremities: no edema or cyanosis. Skin: warm and dry, no rashes noted Neuro: alert, no obvious focal deficits Psych: Normal affect and mood  Assessment & Plan:  Primary hypertension Assessment & Plan: BP: 108/79 today. Poorly controlled. Goal of <130/80. Continue to work on healthy dietary habits and exercise.  He has a personal goal of coming off of this medication.  Given control, this may be possible in the future. Medication regimen: Olmesartan-HCTZ 40-25 1/2 tablet daily   Screen for colon cancer -     Ambulatory referral to Gastroenterology  Healthcare maintenance Assessment & Plan: Holding off on labs today since patient said he just had several labs done a few months ago. He is signing release form, will reach out if there are any labs not done at last visit with his old provider. This would include A1c, CMP, lipid panel, HCV screening, HIV screening.  Further advised Shingrix vaccination at outside pharmacy.   Need for diphtheria-tetanus-pertussis (Tdap) vaccine -     Tdap vaccine greater than or equal to 7yo IM  Return if symptoms worsen or fail to improve. Barrett Shell, Medical Student  08/24/2022, 8:18 AM  I was personally present and performed or re-performed the history, physical exam and medical decision making activities of this service and have verified that the service and findings are accurately documented in the student's note.  Shelby Mattocks, DO                  08/24/2022,  8:18 AM

## 2022-08-24 ENCOUNTER — Encounter: Payer: Self-pay | Admitting: Student

## 2022-08-24 DIAGNOSIS — Z0000 Encounter for general adult medical examination without abnormal findings: Secondary | ICD-10-CM | POA: Insufficient documentation

## 2022-08-24 DIAGNOSIS — G4730 Sleep apnea, unspecified: Secondary | ICD-10-CM | POA: Insufficient documentation

## 2022-08-24 NOTE — Assessment & Plan Note (Signed)
Holding off on labs today since patient said he just had several labs done a few months ago. He is signing release form, will reach out if there are any labs not done at last visit with his old provider. This would include A1c, CMP, lipid panel, HCV screening, HIV screening.  Further advised Shingrix vaccination at outside pharmacy.

## 2022-09-11 ENCOUNTER — Encounter: Payer: Self-pay | Admitting: Student

## 2022-09-11 DIAGNOSIS — R7303 Prediabetes: Secondary | ICD-10-CM | POA: Insufficient documentation

## 2022-09-11 DIAGNOSIS — E785 Hyperlipidemia, unspecified: Secondary | ICD-10-CM | POA: Insufficient documentation

## 2022-09-11 DIAGNOSIS — N529 Male erectile dysfunction, unspecified: Secondary | ICD-10-CM | POA: Insufficient documentation

## 2022-11-18 ENCOUNTER — Other Ambulatory Visit: Payer: Self-pay

## 2022-11-18 MED ORDER — OLMESARTAN MEDOXOMIL-HCTZ 40-25 MG PO TABS: 0.5000 | ORAL_TABLET | Freq: Every day | ORAL | 1 refills | Status: DC

## 2023-01-25 DIAGNOSIS — J069 Acute upper respiratory infection, unspecified: Secondary | ICD-10-CM | POA: Diagnosis not present

## 2023-01-25 DIAGNOSIS — R509 Fever, unspecified: Secondary | ICD-10-CM | POA: Diagnosis not present

## 2023-01-25 DIAGNOSIS — R059 Cough, unspecified: Secondary | ICD-10-CM | POA: Diagnosis not present

## 2023-01-25 DIAGNOSIS — Z6831 Body mass index (BMI) 31.0-31.9, adult: Secondary | ICD-10-CM | POA: Diagnosis not present

## 2023-06-06 ENCOUNTER — Ambulatory Visit (INDEPENDENT_AMBULATORY_CARE_PROVIDER_SITE_OTHER): Admitting: Student

## 2023-06-06 VITALS — BP 104/86 | HR 93 | Temp 99.5°F | Ht 67.0 in | Wt 194.0 lb

## 2023-06-06 DIAGNOSIS — I878 Other specified disorders of veins: Secondary | ICD-10-CM

## 2023-06-06 NOTE — Patient Instructions (Addendum)
 It was great to see you today! Thank you for choosing [facility] for your primary care.  Today we addressed: Venous stasis: Recommend elevation and compression stockings daily.  If you haven't already, sign up for My Chart to have easy access to your labs results, and communication with your primary care physician.  Return if symptoms worsen or fail to improve. Please arrive 15 minutes before your appointment to ensure smooth check in process.  We appreciate your efforts in making this happen.  Thank you for allowing me to participate in your care, Shelby Mattocks, DO 06/06/2023, 4:43 PM PGY-3, [facility]

## 2023-06-06 NOTE — Progress Notes (Unsigned)
  SUBJECTIVE:   CHIEF COMPLAINT / HPI:   The patient, with an unknown past medical history, presents with bilateral lower extremity swelling that has been ongoing for a couple of months. The swelling is from the mid-lower leg down to the feet and fluctuates throughout the day. The patient reports that the swelling is usually worse in the morning and depends on his activity level during the day. The patient's job involves a lot of walking, averaging about five miles a day. The patient denies any pain or fluid leakage from the legs. The swelling does not hinder the patient's daily routine or job. The patient has not tried any interventions to alleviate the swelling. The patient's spouse reports that the patient often falls asleep on the couch with his legs bent for about three to four hours, which seems to exacerbate the swelling.  In addition to the leg swelling, the patient also reports a fever and diarrhea that started yesterday. The patient's spouse reports that the patient has been feeling more tired since the onset of these symptoms. The patient denies any shortness of breath or changes in his ability to move around or exercise.  PERTINENT  PMH / PSH: HTN, HLD, prediabetes  OBJECTIVE:  BP 104/86   Pulse 93   Temp 99.5 F (37.5 C)   Ht 5\' 7"  (1.702 m)   Wt 194 lb (88 kg)   SpO2 100%   BMI 30.38 kg/m  EXTREMITIES: Pedal pulses intact bilaterally. Spider veins on the shin bilaterally. Decent hair on legs with some loss of hair growth. Swelling from mid lower leg to ankle bilaterally. Pitting edema with 2-3 minute return to normal.  ASSESSMENT/PLAN:   Assessment & Plan Venous stasis Bilateral lower extremity swelling from mid-lower leg to ankle for several months, with variable pitting edema lasting 2-3 minutes. No pain or weeping. Extensive walking job, averaging five miles daily. Symptoms worse in the morning, possibly due to sleeping with legs bent. Examination reveals spider veins and  pitting edema. Diagnosis of venous stasis due to prolonged standing and age-related venous valve insufficiency. - Elevate legs, especially when sleeping or resting, to aid venous return. - Use compression stockings during the day to reduce swelling. - Emphasize consistent use of compression stockings for optimal outcomes. Return if symptoms worsen or fail to improve. Shelby Mattocks, DO 06/06/2023, 5:26 PM PGY-3, [facility] {    This will disappear when note is signed, click to select method of visit    :1}

## 2023-06-20 ENCOUNTER — Ambulatory Visit: Admitting: Student

## 2023-06-20 ENCOUNTER — Encounter: Payer: Self-pay | Admitting: Student

## 2023-06-20 VITALS — BP 118/70 | HR 62 | Ht 67.0 in | Wt 190.8 lb

## 2023-06-20 DIAGNOSIS — Z1211 Encounter for screening for malignant neoplasm of colon: Secondary | ICD-10-CM

## 2023-06-20 DIAGNOSIS — R7303 Prediabetes: Secondary | ICD-10-CM

## 2023-06-20 DIAGNOSIS — Z0000 Encounter for general adult medical examination without abnormal findings: Secondary | ICD-10-CM | POA: Diagnosis not present

## 2023-06-20 DIAGNOSIS — E785 Hyperlipidemia, unspecified: Secondary | ICD-10-CM

## 2023-06-20 DIAGNOSIS — Z114 Encounter for screening for human immunodeficiency virus [HIV]: Secondary | ICD-10-CM | POA: Diagnosis not present

## 2023-06-20 DIAGNOSIS — I10 Essential (primary) hypertension: Secondary | ICD-10-CM | POA: Diagnosis not present

## 2023-06-20 DIAGNOSIS — Z1159 Encounter for screening for other viral diseases: Secondary | ICD-10-CM

## 2023-06-20 DIAGNOSIS — Z23 Encounter for immunization: Secondary | ICD-10-CM

## 2023-06-20 LAB — POCT GLYCOSYLATED HEMOGLOBIN (HGB A1C): Hemoglobin A1C: 5.6 % (ref 4.0–5.6)

## 2023-06-20 MED ORDER — OLMESARTAN MEDOXOMIL-HCTZ 40-25 MG PO TABS: 0.5000 | ORAL_TABLET | Freq: Every day | ORAL | 1 refills | Status: DC

## 2023-06-20 NOTE — Assessment & Plan Note (Signed)
 Recheck A1c

## 2023-06-20 NOTE — Assessment & Plan Note (Signed)
 Recheck lipid panel.  Previous LDL 108, did not meet criteria at that time for initiation of statin.

## 2023-06-20 NOTE — Patient Instructions (Addendum)
 It was great to see you today! Thank you for choosing [facility] for your primary care.  Today we addressed: I do recommend you be seen by dentistry some point in the future.  I have submitted a referral for you to get a colonoscopy.  Please make sure to get your shingles vaccine at outside pharmacy.  Things to do to Keep yourself Healthy  - Exercise at least 30-45 minutes a day, 3-4 days a week. >150 min of moderate intensity per week is advised. - Eat a low-fat diet with lots of fruits and vegetables, up to 7-9 servings per day. - Seatbelts can save your life. Wear them always. - Smoke detectors on every level of your home, check batteries every year. - Eye Doctor - have an eye exam every 1-2 years. - Safe sex - if you may be exposed to STDs, use a condom. - Alcohol If you drink, do it moderately, less than 2 drinks per day. - Health Care Power of Attorney.  Choose someone to speak for you if you are not able. - Depression is common in our stressful world.If you're feeling down or losing interest in things you normally enjoy, please come in for a visit.   If you haven't already, sign up for My Chart to have easy access to your labs results, and communication with your primary care physician. We are checking some labs today. If they are abnormal, I will call you. If they are normal, I will send you a MyChart message (if it is active) or a letter in the mail. If you do not hear about your labs in the next 2 weeks, please call the office. Return in about 1 year (around 06/19/2024) for Annual physical. Please arrive 15 minutes before your appointment to ensure smooth check in process.  We appreciate your efforts in making this happen.  Thank you for allowing me to participate in your care, Stephen Mattocks, DO 06/20/2023, 4:22 PM PGY-3, [facility]

## 2023-06-20 NOTE — Assessment & Plan Note (Signed)
 BP: 118/70 today. Well controlled. Goal of <140/90. Continue to work on healthy dietary habits and exercise. Medication regimen: Olmesartan-HCTZ 20-12.5 mg daily.

## 2023-06-20 NOTE — Progress Notes (Signed)
  SUBJECTIVE:   Chief compliant/HPI: annual examination  Stephen Sanders is a 58 y.o. who presents today for an annual exam.   History tabs reviewed and updated.   OBJECTIVE:  BP 118/70   Pulse 62   Ht 5\' 7"  (1.702 m)   Wt 190 lb 12.8 oz (86.5 kg)   SpO2 97%   BMI 29.88 kg/m   General: Well-appearing, NAD HEENT: Left TM normal, right TM slightly obstructed by cerumen, no thyromegaly or nodularity CV: RRR, no murmurs auscultated Pulm: CTAB, normal WOB Abdomen: Soft, nontender, normoactive bowel sounds  ASSESSMENT/PLAN:   Assessment & Plan Annual physical exam COVID booster today.  Counseled on receiving shingles vaccine at pharmacy. Recommended dentistry follow-up if able. Considered the following screening exams based upon USPSTF recommendations: Diabetes screening: ordered Screening for elevated cholesterol: ordered HIV testing: ordered Hepatitis C: ordered Hepatitis B: not indicated Syphilis if at high risk: not indicated Reviewed risk factors for latent tuberculosis and not indicated Colorectal cancer screening: discussed, colonoscopy ordered Lung cancer screening: Not indicated Primary hypertension BP: 118/70 today. Well controlled. Goal of <140/90. Continue to work on healthy dietary habits and exercise. Medication regimen: Olmesartan-HCTZ 20-12.5 mg daily. Hyperlipidemia, unspecified hyperlipidemia type Recheck lipid panel.  Previous LDL 108, did not meet criteria at that time for initiation of statin. Prediabetes Recheck A1c. Screen for colon cancer Referral to GI for colonoscopy.  Follow up in 1 year or sooner if indicated.   Shelby Mattocks, DO [facility]

## 2023-06-21 LAB — BASIC METABOLIC PANEL WITH GFR
BUN/Creatinine Ratio: 19 (ref 9–20)
BUN: 19 mg/dL (ref 6–24)
CO2: 24 mmol/L (ref 20–29)
Calcium: 9.8 mg/dL (ref 8.7–10.2)
Chloride: 101 mmol/L (ref 96–106)
Creatinine, Ser: 1 mg/dL (ref 0.76–1.27)
Glucose: 85 mg/dL (ref 70–99)
Potassium: 4.1 mmol/L (ref 3.5–5.2)
Sodium: 138 mmol/L (ref 134–144)
eGFR: 89 mL/min/{1.73_m2} (ref >59)

## 2023-06-21 LAB — LIPID PANEL
Chol/HDL Ratio: 3.6 ratio (ref 0.0–5.0)
Cholesterol, Total: 150 mg/dL (ref 100–199)
HDL: 42 mg/dL (ref >39)
LDL Chol Calc (NIH): 82 mg/dL (ref 0–99)
Triglycerides: 149 mg/dL (ref 0–149)
VLDL Cholesterol Cal: 26 mg/dL (ref 5–40)

## 2023-06-21 LAB — HCV AB W REFLEX TO QUANT PCR: HCV Ab: NONREACTIVE

## 2023-06-21 LAB — HIV ANTIBODY (ROUTINE TESTING W REFLEX): HIV Screen 4th Generation wRfx: NONREACTIVE

## 2023-06-21 LAB — HCV INTERPRETATION

## 2023-06-24 ENCOUNTER — Encounter: Payer: Self-pay | Admitting: Student

## 2023-07-18 ENCOUNTER — Encounter: Payer: Self-pay | Admitting: Internal Medicine

## 2023-08-04 ENCOUNTER — Encounter: Payer: Self-pay | Admitting: Internal Medicine

## 2023-08-04 ENCOUNTER — Other Ambulatory Visit: Payer: Self-pay

## 2023-08-04 ENCOUNTER — Ambulatory Visit (AMBULATORY_SURGERY_CENTER)

## 2023-08-04 VITALS — Ht 67.0 in | Wt 190.0 lb

## 2023-08-04 DIAGNOSIS — Z1211 Encounter for screening for malignant neoplasm of colon: Secondary | ICD-10-CM

## 2023-08-04 MED ORDER — NA SULFATE-K SULFATE-MG SULF 17.5-3.13-1.6 GM/177ML PO SOLN: 1.0000 | Freq: Once | ORAL | 0 refills | Status: AC

## 2023-08-04 NOTE — Progress Notes (Signed)
 Denies allergies to eggs or soy products. Denies complication of anesthesia or sedation. Denies use of weight loss medication. Denies use of O2.   Emmi instructions given for colonoscopy.

## 2023-08-18 ENCOUNTER — Telehealth: Payer: Self-pay | Admitting: Internal Medicine

## 2023-08-18 NOTE — Telephone Encounter (Signed)
 PT is scheduled for a colonoscopy on tomorrow with Dr. Rosaline Coma. He should be on clear liquids all day but he has already had breakfast as of 9am. PT would like to know if he can still proceed with the appointment as scheduled.n PT is requesting that we call his wife since he is at work [place], [phone]

## 2023-08-18 NOTE — Telephone Encounter (Signed)
 Returned call. Pt has not yet received his instructions in the mail from his pre-visit. Spoke with his wife Trevor Fudge per pt's request. Sent a text to her phone with a link to sign up for MyChart. Once she activates that ,RN will send instructions via MyChart. Reviewed clear liquid diet over the phone and NPO times. Also instructed to follow our instruction, not the instructions on the prep box. Pt had already eaten breakfast this morning. His wife will communicate with him about clear liquid diet for today and tomorrow. Instructed to call back if any further questions.

## 2023-08-19 ENCOUNTER — Encounter: Payer: Self-pay | Admitting: Internal Medicine

## 2023-08-19 ENCOUNTER — Ambulatory Visit: Admitting: Internal Medicine

## 2023-08-19 VITALS — BP 123/67 | HR 77 | Temp 98.8°F | Resp 12 | Ht 67.0 in | Wt 190.0 lb

## 2023-08-19 DIAGNOSIS — D123 Benign neoplasm of transverse colon: Secondary | ICD-10-CM

## 2023-08-19 DIAGNOSIS — K648 Other hemorrhoids: Secondary | ICD-10-CM | POA: Diagnosis not present

## 2023-08-19 DIAGNOSIS — Z1211 Encounter for screening for malignant neoplasm of colon: Secondary | ICD-10-CM | POA: Diagnosis not present

## 2023-08-19 DIAGNOSIS — K5730 Diverticulosis of large intestine without perforation or abscess without bleeding: Secondary | ICD-10-CM | POA: Diagnosis not present

## 2023-08-19 DIAGNOSIS — K635 Polyp of colon: Secondary | ICD-10-CM

## 2023-08-19 DIAGNOSIS — D122 Benign neoplasm of ascending colon: Secondary | ICD-10-CM

## 2023-08-19 MED ORDER — SODIUM CHLORIDE 0.9 % IV SOLN: 500.0000 mL | Freq: Once | INTRAVENOUS | Status: DC

## 2023-08-19 NOTE — Progress Notes (Signed)
 Called to room to assist during endoscopic procedure.  Patient ID and intended procedure confirmed with present staff. Received instructions for my participation in the procedure from the performing physician.

## 2023-08-19 NOTE — Progress Notes (Signed)
 Sedate, gd SR, tolerated procedure well, VSS, report to RN

## 2023-08-19 NOTE — Op Note (Signed)
 [facility] Patient Name: Stephen Sanders Procedure Date: 08/19/2023 4:14 PM MRN: [id number] Endoscopist: Pedro Bourgeois , , [id number] Age: 58 Referring MD:  Date of Birth: 1965-10-06 Gender: Male Account #: [id number] Procedure:                Colonoscopy Indications:              Screening for colorectal malignant neoplasm, This                            is the patient's first colonoscopy Medicines:                Monitored Anesthesia Care Procedure:                Pre-Anesthesia Assessment:                           - Prior to the procedure, a History and Physical                            was performed, and patient medications and                            allergies were reviewed. The patient's tolerance of                            previous anesthesia was also reviewed. The risks                            and benefits of the procedure and the sedation                            options and risks were discussed with the patient.                            All questions were answered, and informed consent                            was obtained. Prior Anticoagulants: The patient has                            taken no anticoagulant or antiplatelet agents. ASA                            Grade Assessment: II - A patient with mild systemic                            disease. After reviewing the risks and benefits,                            the patient was deemed in satisfactory condition to                            undergo the procedure.  After obtaining informed consent, the colonoscope                            was passed under direct vision. Throughout the                            procedure, the patient's blood pressure, pulse, and                            oxygen saturations were monitored continuously. The                            CF [id number] #[id number] was introduced through the anus                            and advanced to  the the terminal ileum. The                            colonoscopy was performed without difficulty. The                            patient tolerated the procedure well. The quality                            of the bowel preparation was excellent. The                            terminal ileum, ileocecal valve, appendiceal                            orifice, and rectum were photographed. Scope In: 4:24:54 PM Scope Out: 4:37:56 PM Scope Withdrawal Time: 0 hours 9 minutes 36 seconds  Total Procedure Duration: 0 hours 13 minutes 2 seconds  Findings:                 The terminal ileum appeared normal.                           Two sessile polyps were found in the transverse                            colon and ascending colon. The polyps were 4 to 6                            mm in size. These polyps were removed with a cold                            snare. Resection and retrieval were complete.                           Multiple diverticula were found in the sigmoid                            colon.  Non-bleeding internal hemorrhoids were found during                            retroflexion. Complications:            No immediate complications. Estimated Blood Loss:     Estimated blood loss was minimal. Impression:               - The examined portion of the ileum was normal.                           - Two 4 to 6 mm polyps in the transverse colon and                            in the ascending colon, removed with a cold snare.                            Resected and retrieved.                           - Diverticulosis in the sigmoid colon.                           - Non-bleeding internal hemorrhoids. Recommendation:           - Discharge patient to home (with escort).                           - Await pathology results.                           - The findings and recommendations were discussed                            with the patient. Dr Pedro Bourgeois "Anastacio Balm" Stephen Sanders,  08/19/2023 4:40:20 PM

## 2023-08-19 NOTE — Patient Instructions (Signed)
-  Handout on polyps, diverticulosis provided -await pathology results -repeat colonoscopy for surveillance recommended. Date to be determined when pathology result become available   -Continue present medications   YOU HAD AN ENDOSCOPIC PROCEDURE TODAY AT [place]:   Refer to the procedure report that was given to you for any specific questions about what was found during the examination.  If the procedure report does not answer your questions, please call your gastroenterologist to clarify.  If you requested that your care partner not be given the details of your procedure findings, then the procedure report has been included in a sealed envelope for you to review at your convenience later.  YOU SHOULD EXPECT: Some feelings of bloating in the abdomen. Passage of more gas than usual.  Walking can help get rid of the air that was put into your GI tract during the procedure and reduce the bloating. If you had a lower endoscopy (such as a colonoscopy or flexible sigmoidoscopy) you may notice spotting of blood in your stool or on the toilet paper. If you underwent a bowel prep for your procedure, you may not have a normal bowel movement for a few days.  Please Note:  You might notice some irritation and congestion in your nose or some drainage.  This is from the oxygen used during your procedure.  There is no need for concern and it should clear up in a day or so.  SYMPTOMS TO REPORT IMMEDIATELY:  Following lower endoscopy (colonoscopy or flexible sigmoidoscopy):  Excessive amounts of blood in the stool  Significant tenderness or worsening of abdominal pains  Swelling of the abdomen that is new, acute  Fever of 100F or higher   For urgent or emergent issues, a gastroenterologist can be reached at any hour by calling [phone]. Do not use MyChart messaging for urgent concerns.    DIET:  We do recommend a small meal at first, but then you may proceed to your regular diet.   Drink plenty of fluids but you should avoid alcoholic beverages for 24 hours.  ACTIVITY:  You should plan to take it easy for the rest of today and you should NOT DRIVE or use heavy machinery until tomorrow (because of the sedation medicines used during the test).    FOLLOW UP: Our staff will call the number listed on your records the next business day following your procedure.  We will call around 7:15- 8:00 am to check on you and address any questions or concerns that you may have regarding the information given to you following your procedure. If we do not reach you, we will leave a message.     If any biopsies were taken you will be contacted by phone or by letter within the next 1-3 weeks.  Please call us at [phone] if you have not heard about the biopsies in 3 weeks.    SIGNATURES/CONFIDENTIALITY: You and/or your care partner have signed paperwork which will be entered into your electronic medical record.  These signatures attest to the fact that that the information above on your After Visit Summary has been reviewed and is understood.  Full responsibility of the confidentiality of this discharge information lies with you and/or your care-partner.

## 2023-08-19 NOTE — Progress Notes (Signed)
 Pt's states no medical or surgical changes since previsit or office visit.

## 2023-08-19 NOTE — Progress Notes (Signed)
 GASTROENTEROLOGY PROCEDURE H&P NOTE   Primary Care Physician: Veronia Goon, DO    Reason for Procedure:   Colon cancer screening  Plan:    Colonoscopy  Patient is appropriate for endoscopic procedure(s) in the ambulatory ([facility]) setting.  The nature of the procedure, as well as the risks, benefits, and alternatives were carefully and thoroughly reviewed with the patient. Ample time for discussion and questions allowed. The patient understood, was satisfied, and agreed to proceed.     HPI: Stephen Sanders is a 58 y.o. male who presents for colonoscopy for colon cancer screening. Denies blood in stools, changes in bowel habits, or unintentional weight loss. Denies family history of colon cancer.  Past Medical History:  Diagnosis Date   Allergy    Hypertension    Kidney stone    Renal disorder    kidney stones   Sleep apnea     Past Surgical History:  Procedure Laterality Date   VASECTOMY      Prior to Admission medications   Medication Sig Start Date End Date Taking? Authorizing Provider  loratadine (CLARITIN REDITABS) 10 MG dissolvable tablet Take 10 mg by mouth as needed for allergies.    [provider]  olmesartan -hydrochlorothiazide (BENICAR  HCT) 40-25 MG tablet Take 0.5 tablets by mouth daily. 06/20/23   Veronia Goon, DO    Current Outpatient Medications  Medication Sig Dispense Refill   loratadine (CLARITIN REDITABS) 10 MG dissolvable tablet Take 10 mg by mouth as needed for allergies.     olmesartan -hydrochlorothiazide (BENICAR  HCT) 40-25 MG tablet Take 0.5 tablets by mouth daily. 90 tablet 1   Current Facility-Administered Medications  Medication Dose Route Frequency Provider Last Rate Last Admin   0.9 %  sodium chloride  infusion  500 mL Intravenous Once Daina Drum, MD        Allergies as of 08/19/2023   (No Known Allergies)    Family History  Problem Relation Age of Onset   Anemia Mother    Skin cancer Mother    Congestive  Heart Failure Mother    Heart attack Father    Colon cancer Neg Hx    Esophageal cancer Neg Hx    Stomach cancer Neg Hx    Rectal cancer Neg Hx     Social History   Socioeconomic History   Marital status: Married    Spouse name: Not on file   Number of children: Not on file   Years of education: Not on file   Highest education level: Not on file  Occupational History   Not on file  Tobacco Use   Smoking status: Never   Smokeless tobacco: Never  Vaping Use   Vaping status: Never Used  Substance and Sexual Activity   Alcohol use: No   Drug use: No   Sexual activity: Not on file  Other Topics Concern   Not on file  Social History Narrative   Not on file   Social Drivers of Health   Financial Resource Strain: Not on file  Food Insecurity: Not on file  Transportation Needs: Not on file  Physical Activity: Not on file  Stress: Not on file  Social Connections: Not on file  Intimate Partner Violence: Not on file    Physical Exam: Vital signs in last 24 hours: BP 127/70   Pulse 77   Temp 98.8 F (37.1 C) (Skin)   Ht 5\' 7"  (1.702 m)   Wt 190 lb (86.2 kg)   SpO2 97%   BMI  29.76 kg/m  GEN: NAD EYE: Sclerae anicteric ENT: MMM CV: Non-tachycardic Pulm: No increased work of breathing GI: Soft, NT/ND NEURO:  Alert & Oriented   Stephen Caprio, MD [facility]  08/19/2023 3:28 PM

## 2023-08-22 ENCOUNTER — Telehealth: Payer: Self-pay

## 2023-08-22 NOTE — Telephone Encounter (Signed)
  Follow up Call-     08/19/2023    3:25 PM  Call back number  Post procedure Call Back phone  # [phone]  Permission to leave phone message Yes     Patient questions:  Do you have a fever, pain , or abdominal swelling? No. Pain Score  0 *  Have you tolerated food without any problems? Yes.    Have you been able to return to your normal activities? Yes.    Do you have any questions about your discharge instructions: Diet   No. Medications  No. Follow up visit  No.  Do you have questions or concerns about your Care? No.  Actions: * If pain score is 4 or above: No action needed, pain <4.

## 2023-08-23 ENCOUNTER — Encounter: Payer: Self-pay | Admitting: *Deleted

## 2023-08-24 ENCOUNTER — Ambulatory Visit: Payer: Self-pay | Admitting: Internal Medicine

## 2023-08-24 LAB — SURGICAL PATHOLOGY

## 2023-12-13 ENCOUNTER — Other Ambulatory Visit: Payer: Self-pay

## 2023-12-13 DIAGNOSIS — I10 Essential (primary) hypertension: Secondary | ICD-10-CM

## 2023-12-13 MED ORDER — OLMESARTAN MEDOXOMIL-HCTZ 40-25 MG PO TABS: 0.5000 | ORAL_TABLET | Freq: Every day | ORAL | 1 refills | Status: AC

## 2024-02-25 DIAGNOSIS — S61214A Laceration without foreign body of right ring finger without damage to nail, initial encounter: Secondary | ICD-10-CM | POA: Diagnosis not present

## 2024-02-25 DIAGNOSIS — W261XXA Contact with sword or dagger, initial encounter: Secondary | ICD-10-CM | POA: Diagnosis not present

## 2024-04-06 ENCOUNTER — Ambulatory Visit
Admission: EM | Admit: 2024-04-06 | Discharge: 2024-04-06 | Disposition: A | Discharge status: Home / Self Care | Attending: Physician Assistant | Admitting: Physician Assistant

## 2024-04-06 ENCOUNTER — Encounter: Payer: Self-pay | Admitting: Emergency Medicine

## 2024-04-06 DIAGNOSIS — B349 Viral infection, unspecified: Secondary | ICD-10-CM

## 2024-04-06 LAB — POC COVID19/FLU A&B COMBO
Covid Antigen, POC: NEGATIVE
Influenza A Antigen, POC: NEGATIVE
Influenza B Antigen, POC: NEGATIVE

## 2024-04-06 MED ORDER — OSELTAMIVIR PHOSPHATE 75 MG PO CAPS: 75.0000 mg | ORAL_CAPSULE | Freq: Two times a day (BID) | ORAL | 0 refills | Status: AC
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
## 2024-04-06 NOTE — Discharge Instructions (Addendum)
 Repeat a home influenza test tomorrow. Fill the prescription for tamiflu if your test is positive .  Return if any problems.  Tylenol  for fever or chills.

## 2024-04-06 NOTE — ED Triage Notes (Signed)
 Sore throat, nasal congestion, cough, sneezing, since yesterday.  Has been taking nyquil
